# Patient Record
Sex: Female | Born: 1970 | Race: White | Hispanic: No | Marital: Single | Smoking: Former smoker
Health system: Southern US, Community
[De-identification: ages and names within clinical notes are randomized; demographics above are authoritative.]

## PROBLEM LIST (undated history)

## (undated) DIAGNOSIS — C569 Malignant neoplasm of unspecified ovary: Secondary | ICD-10-CM

## (undated) DIAGNOSIS — B001 Herpesviral vesicular dermatitis: Secondary | ICD-10-CM

## (undated) DIAGNOSIS — F329 Major depressive disorder, single episode, unspecified: Secondary | ICD-10-CM

## (undated) DIAGNOSIS — F32A Depression, unspecified: Secondary | ICD-10-CM

## (undated) DIAGNOSIS — N390 Urinary tract infection, site not specified: Secondary | ICD-10-CM

## (undated) HISTORY — PX: ABDOMINAL HYSTERECTOMY: SHX81

---

## 2008-03-09 ENCOUNTER — Emergency Department (HOSPITAL_COMMUNITY): Admission: EM | Admit: 2008-03-09 | Discharge: 2008-03-09 | Payer: Self-pay | Admitting: Emergency Medicine

## 2011-10-13 ENCOUNTER — Emergency Department (HOSPITAL_BASED_OUTPATIENT_CLINIC_OR_DEPARTMENT_OTHER)
Admission: EM | Admit: 2011-10-13 | Discharge: 2011-10-13 | Disposition: A | Discharge status: Home Health | Payer: Medicare Other | Attending: Emergency Medicine | Admitting: Emergency Medicine

## 2011-10-13 ENCOUNTER — Encounter (HOSPITAL_BASED_OUTPATIENT_CLINIC_OR_DEPARTMENT_OTHER): Payer: Self-pay | Admitting: *Deleted

## 2011-10-13 DIAGNOSIS — R300 Dysuria: Secondary | ICD-10-CM | POA: Insufficient documentation

## 2011-10-13 DIAGNOSIS — Z79899 Other long term (current) drug therapy: Secondary | ICD-10-CM | POA: Insufficient documentation

## 2011-10-13 DIAGNOSIS — N390 Urinary tract infection, site not specified: Secondary | ICD-10-CM | POA: Insufficient documentation

## 2011-10-13 DIAGNOSIS — R3915 Urgency of urination: Secondary | ICD-10-CM | POA: Insufficient documentation

## 2011-10-13 DIAGNOSIS — F3289 Other specified depressive episodes: Secondary | ICD-10-CM | POA: Insufficient documentation

## 2011-10-13 DIAGNOSIS — F17200 Nicotine dependence, unspecified, uncomplicated: Secondary | ICD-10-CM | POA: Insufficient documentation

## 2011-10-13 DIAGNOSIS — F329 Major depressive disorder, single episode, unspecified: Secondary | ICD-10-CM | POA: Insufficient documentation

## 2011-10-13 DIAGNOSIS — R350 Frequency of micturition: Secondary | ICD-10-CM | POA: Insufficient documentation

## 2011-10-13 HISTORY — DX: Urinary tract infection, site not specified: N39.0

## 2011-10-13 HISTORY — DX: Depression, unspecified: F32.A

## 2011-10-13 HISTORY — DX: Major depressive disorder, single episode, unspecified: F32.9

## 2011-10-13 LAB — URINALYSIS, ROUTINE W REFLEX MICROSCOPIC
Bilirubin Urine: NEGATIVE
Hgb urine dipstick: NEGATIVE
Ketones, ur: NEGATIVE mg/dL
Nitrite: NEGATIVE
Protein, ur: NEGATIVE mg/dL
Urobilinogen, UA: 0.2 mg/dL (ref 0.0–1.0)

## 2011-10-13 LAB — PREGNANCY, URINE: Preg Test, Ur: NEGATIVE

## 2011-10-13 MED ORDER — PHENAZOPYRIDINE HCL 200 MG PO TABS: 200.0000 mg | ORAL_TABLET | Freq: Three times a day (TID) | ORAL | Status: AC

## 2011-10-13 MED ORDER — NITROFURANTOIN MONOHYD MACRO 100 MG PO CAPS: 100.0000 mg | ORAL_CAPSULE | Freq: Two times a day (BID) | ORAL | Status: AC

## 2011-10-13 NOTE — ED Provider Notes (Signed)
History     CSN: [id number]  Arrival date & time 10/13/11  1422   First MD Initiated Contact with Patient 10/13/11 1501      Chief Complaint  Patient presents with  . Dysuria  . Urinary Frequency  . Urinary Urgency    (Consider location/radiation/quality/duration/timing/severity/associated sxs/prior treatment) Patient is a 41 y.o. female presenting with dysuria. The history is provided by the patient. No language interpreter was used.  Dysuria  This is a new problem. The current episode started yesterday. The problem occurs every urination. The problem has not changed since onset.The quality of the pain is described as burning. The pain is mild. There has been no fever. She is not sexually active. There is no history of pyelonephritis. Associated symptoms include urgency. Pertinent negatives include no hematuria. She has tried nothing for the symptoms. Her past medical history is significant for recurrent UTIs. Her past medical history does not include kidney stones.    Past Medical History  Diagnosis Date  . Depression   . Urinary tract infection     Past Surgical History  Procedure Date  . Abdominal hysterectomy     History reviewed. No pertinent family history.  History  Substance Use Topics  . Smoking status: Current Everyday Smoker  . Smokeless tobacco: Not on file  . Alcohol Use: Yes    OB History    Grav Para Term Preterm Abortions TAB SAB Ect Mult Living                  Review of Systems  Genitourinary: Positive for dysuria and urgency. Negative for hematuria, vaginal bleeding, vaginal discharge, vaginal pain, menstrual problem and pelvic pain.  All other systems reviewed and are negative.    Allergies  Review of patient's allergies indicates no known allergies.  Home Medications   Current Outpatient Rx  Name Route Sig Dispense Refill  . SERTRALINE HCL 50 MG PO TABS Oral Take 50 mg by mouth daily.      BP 121/88  Pulse 97  Temp(Src) 97.7 F  (36.5 C) (Oral)  Resp 20  Ht 5\' 3"  (1.6 m)  Wt 160 lb (72.576 kg)  BMI 28.34 kg/m2  SpO2 98%  Physical Exam  Nursing note and vitals reviewed. Constitutional: She is oriented to person, place, and time. She appears well-developed and well-nourished.  HENT:  Head: Normocephalic and atraumatic.  Right Ear: External ear normal.  Left Ear: External ear normal.  Mouth/Throat: Oropharynx is clear and moist.  Neck: Normal range of motion. Neck supple.  Cardiovascular: Normal rate and normal heart sounds.   Pulmonary/Chest: Effort normal and breath sounds normal.  Abdominal: Soft. Bowel sounds are normal.  Genitourinary: Vagina normal.  Musculoskeletal: Normal range of motion.  Neurological: She is alert and oriented to person, place, and time. She has normal reflexes.  Skin: Skin is warm.  Psychiatric: She has a normal mood and affect.    ED Course  Procedures (including critical care time)   Labs Reviewed  PREGNANCY, URINE  URINALYSIS, ROUTINE W REFLEX MICROSCOPIC   No results found.   No diagnosis found.    MDM  I will treat with macrobid and pyridium due to symptoms.  Pt advised to return if any problems.       Lonia Skinner [place], [place] 10/13/11 1527

## 2011-10-13 NOTE — ED Notes (Signed)
Pt denies vaginal discharge, itching.

## 2011-10-13 NOTE — ED Provider Notes (Signed)
Medical screening examination/treatment/procedure(s) were performed by non-physician practitioner and as supervising physician I was immediately available for consultation/collaboration.   Soliyana Mcchristian A Alexya Mcdaris, MD 10/13/11 2354 

## 2011-10-13 NOTE — ED Notes (Signed)
Pt amb to triage with quick steady gait in nad. Pt reports her usual uti sx x Monday, dysuria, frequency, urgency, denies any fevers.

## 2011-10-13 NOTE — Discharge Instructions (Signed)
Urinary Tract Infection Infections of the urinary tract can start in several places. A bladder infection (cystitis), a kidney infection (pyelonephritis), and a prostate infection (prostatitis) are different types of urinary tract infections (UTIs). They usually get better if treated with medicines (antibiotics) that kill germs. Take all the medicine until it is gone. You or your child may feel better in a few days, but TAKE ALL MEDICINE or the infection may not respond and may become more difficult to treat. HOME CARE INSTRUCTIONS   Drink enough water and fluids to keep the urine clear or pale yellow. Cranberry juice is especially recommended, in addition to large amounts of water.   Avoid caffeine, tea, and carbonated beverages. They tend to irritate the bladder.   Alcohol may irritate the prostate.   Only take over-the-counter or prescription medicines for pain, discomfort, or fever as directed by your caregiver.  To prevent further infections:  Empty the bladder often. Avoid holding urine for long periods of time.   After a bowel movement, women should cleanse from front to back. Use each tissue only once.   Empty the bladder before and after sexual intercourse.  FINDING OUT THE RESULTS OF YOUR TEST Not all test results are available during your visit. If your or your child's test results are not back during the visit, make an appointment with your caregiver to find out the results. Do not assume everything is normal if you have not heard from your caregiver or the medical facility. It is important for you to follow up on all test results. SEEK MEDICAL CARE IF:   There is back pain.   Your baby is older than 3 months with a rectal temperature of 100.5 F (38.1 C) or higher for more than 1 day.   Your or your child's problems (symptoms) are no better in 3 days. Return sooner if you or your child is getting worse.  SEEK IMMEDIATE MEDICAL CARE IF:   There is severe back pain or lower  abdominal pain.   You or your child develops chills.   You have a fever.   Your baby is older than 3 months with a rectal temperature of 102 F (38.9 C) or higher.   Your baby is 3 months old or younger with a rectal temperature of 100.4 F (38 C) or higher.   There is nausea or vomiting.   There is continued burning or discomfort with urination.  MAKE SURE YOU:   Understand these instructions.   Will watch your condition.   Will get help right away if you are not doing well or get worse.  Document Released: 04/14/2005 Document Revised: 06/24/2011 Document Reviewed: 11/17/2006 Exit[employer] Patient Information 2012 [employer].Urinary Tract Infection Infections of the urinary tract can start in several places. A bladder infection (cystitis), a kidney infection (pyelonephritis), and a prostate infection (prostatitis) are different types of urinary tract infections (UTIs). They usually get better if treated with medicines (antibiotics) that kill germs. Take all the medicine until it is gone. You or your child may feel better in a few days, but TAKE ALL MEDICINE or the infection may not respond and may become more difficult to treat. HOME CARE INSTRUCTIONS   Drink enough water and fluids to keep the urine clear or pale yellow. Cranberry juice is especially recommended, in addition to large amounts of water.   Avoid caffeine, tea, and carbonated beverages. They tend to irritate the bladder.   Alcohol may irritate the prostate.   Only take   over-the-counter or prescription medicines for pain, discomfort, or fever as directed by your caregiver.  To prevent further infections:  Empty the bladder often. Avoid holding urine for long periods of time.   After a bowel movement, women should cleanse from front to back. Use each tissue only once.   Empty the bladder before and after sexual intercourse.  FINDING OUT THE RESULTS OF YOUR TEST Not all test results are available during your  visit. If your or your child's test results are not back during the visit, make an appointment with your caregiver to find out the results. Do not assume everything is normal if you have not heard from your caregiver or the medical facility. It is important for you to follow up on all test results. SEEK MEDICAL CARE IF:   There is back pain.   Your baby is older than 3 months with a rectal temperature of 100.5 F (38.1 C) or higher for more than 1 day.   Your or your child's problems (symptoms) are no better in 3 days. Return sooner if you or your child is getting worse.  SEEK IMMEDIATE MEDICAL CARE IF:   There is severe back pain or lower abdominal pain.   You or your child develops chills.   You have a fever.   Your baby is older than 3 months with a rectal temperature of 102 F (38.9 C) or higher.   Your baby is 3 months old or younger with a rectal temperature of 100.4 F (38 C) or higher.   There is nausea or vomiting.   There is continued burning or discomfort with urination.  MAKE SURE YOU:   Understand these instructions.   Will watch your condition.   Will get help right away if you are not doing well or get worse.  Document Released: 04/14/2005 Document Revised: 06/24/2011 Document Reviewed: 11/17/2006 Exit[employer] Patient Information 2012 [employer]. 

## 2011-10-14 LAB — URINE CULTURE: Culture: NO GROWTH

## 2012-12-23 DIAGNOSIS — F3130 Bipolar disorder, current episode depressed, mild or moderate severity, unspecified: Secondary | ICD-10-CM | POA: Insufficient documentation

## 2012-12-23 DIAGNOSIS — F339 Major depressive disorder, recurrent, unspecified: Secondary | ICD-10-CM | POA: Insufficient documentation

## 2012-12-23 DIAGNOSIS — F410 Panic disorder [episodic paroxysmal anxiety] without agoraphobia: Secondary | ICD-10-CM | POA: Insufficient documentation

## 2015-02-14 ENCOUNTER — Encounter (HOSPITAL_BASED_OUTPATIENT_CLINIC_OR_DEPARTMENT_OTHER): Payer: Self-pay

## 2015-02-14 ENCOUNTER — Emergency Department (HOSPITAL_BASED_OUTPATIENT_CLINIC_OR_DEPARTMENT_OTHER)
Admission: EM | Admit: 2015-02-14 | Discharge: 2015-02-14 | Disposition: A | Discharge status: Home / Self Care | Payer: Medicare Other | Attending: Emergency Medicine | Admitting: Emergency Medicine

## 2015-02-14 DIAGNOSIS — F329 Major depressive disorder, single episode, unspecified: Secondary | ICD-10-CM | POA: Insufficient documentation

## 2015-02-14 DIAGNOSIS — Z8543 Personal history of malignant neoplasm of ovary: Secondary | ICD-10-CM | POA: Insufficient documentation

## 2015-02-14 DIAGNOSIS — Z87440 Personal history of urinary (tract) infections: Secondary | ICD-10-CM | POA: Insufficient documentation

## 2015-02-14 DIAGNOSIS — Z720 Tobacco use: Secondary | ICD-10-CM | POA: Insufficient documentation

## 2015-02-14 DIAGNOSIS — B002 Herpesviral gingivostomatitis and pharyngotonsillitis: Secondary | ICD-10-CM | POA: Insufficient documentation

## 2015-02-14 DIAGNOSIS — Z79899 Other long term (current) drug therapy: Secondary | ICD-10-CM | POA: Insufficient documentation

## 2015-02-14 HISTORY — DX: Malignant neoplasm of unspecified ovary: C56.9

## 2015-02-14 HISTORY — DX: Herpesviral vesicular dermatitis: B00.1

## 2015-02-14 MED ORDER — HYDROCODONE-ACETAMINOPHEN 5-325 MG PO TABS: 1.0000 | ORAL_TABLET | ORAL | Status: DC | PRN

## 2015-02-14 MED ORDER — ACYCLOVIR 200 MG PO CAPS
400.0000 mg | ORAL_CAPSULE | Freq: Once | ORAL | Status: AC
  Administered 2015-02-14: 400 mg via ORAL
  Filled 2015-02-14: qty 2

## 2015-02-14 MED ORDER — IBUPROFEN 800 MG PO TABS
800.0000 mg | ORAL_TABLET | Freq: Once | ORAL | Status: AC
  Administered 2015-02-14: 800 mg via ORAL
  Filled 2015-02-14: qty 1

## 2015-02-14 MED ORDER — HYDROCODONE-ACETAMINOPHEN 5-325 MG PO TABS
2.0000 | ORAL_TABLET | Freq: Once | ORAL | Status: AC
  Administered 2015-02-14: 2 via ORAL
  Filled 2015-02-14: qty 2

## 2015-02-14 MED ORDER — ACYCLOVIR 400 MG PO TABS: 400.0000 mg | ORAL_TABLET | Freq: Every day | ORAL | Status: DC

## 2015-02-14 MED ORDER — IBUPROFEN 800 MG PO TABS: 800.0000 mg | ORAL_TABLET | Freq: Three times a day (TID) | ORAL | Status: DC

## 2015-02-14 NOTE — ED Notes (Signed)
MD at bedside. 

## 2015-02-14 NOTE — ED Notes (Addendum)
Presents with blisters on lips, not feeling well, states had fever last PM

## 2015-02-14 NOTE — ED Notes (Signed)
Has pain when swallowing, but no difficulty swallowing, pain from throat radiates to both ears

## 2015-02-14 NOTE — ED Notes (Signed)
Pt here for fever blisters to lip.  Reports hx of same but "not this bad".  Has not been sick recently.

## 2015-02-14 NOTE — ED Provider Notes (Signed)
CSN: [id number]     Arrival date & time 02/14/15  1438 History   First MD Initiated Contact with Patient 02/14/15 1510     Chief Complaint  Patient presents with  . Oral Pain     (Consider location/radiation/quality/duration/timing/severity/associated sxs/prior Treatment) HPI Patient laid out in the sun on Sunday and got somewhat of sunburn.. She then developed fever blisters on her lips. She states that she has a history of fever blisters but they've never been this bad before. Usually she just gets one but now she has several on both her lower and upper lip. She also reports that her lymph nodes are swollen and she feels achy. She has not had a fever that she knows of however she reports she did have some chills last night. The patient reports that her nodes hurt when she bends her chin down but she is not having any difficulty swallowing or breathing. The patient also denied pain in the back of her neck with bending her head forward. She does reports she's had some waxing and waning headache with headache and her temples. At this time she reports the headache is not too severe. No photophobia, no confusion. No other rash. Past Medical History  Diagnosis Date  . Depression   . Urinary tract infection   . Fever blister   . Ovarian cancer    Past Surgical History  Procedure Laterality Date  . Abdominal hysterectomy     No family history on file. History  Substance Use Topics  . Smoking status: Current Every Day Smoker -- 0.50 packs/day    Types: Cigarettes  . Smokeless tobacco: Not on file  . Alcohol Use: No   OB History    No data available     Review of Systems 10 Systems reviewed and are negative for acute change except as noted in the HPI.    Allergies  Review of patient's allergies indicates no known allergies.  Home Medications   Prior to Admission medications   Medication Sig Start Date End Date Taking? Authorizing Provider  ALPRAZolam Duanne Moron) 0.25 MG tablet Take  0.25 mg by mouth at bedtime as needed for anxiety.   Yes Historical Provider, MD  gabapentin (NEURONTIN) 100 MG capsule Take 100 mg by mouth 3 (three) times daily.   Yes Historical Provider, MD  zolpidem (AMBIEN) 5 MG tablet Take 5 mg by mouth at bedtime as needed for sleep.   Yes Historical Provider, MD  acyclovir (ZOVIRAX) 400 MG tablet Take 1 tablet (400 mg total) by mouth 5 (five) times daily. 02/14/15   Charlesetta Shanks, MD  HYDROcodone-acetaminophen (NORCO/VICODIN) 5-325 MG per tablet Take 1-2 tablets by mouth every 4 (four) hours as needed for moderate pain or severe pain. 02/14/15   Charlesetta Shanks, MD  ibuprofen (ADVIL,MOTRIN) 800 MG tablet Take 1 tablet (800 mg total) by mouth 3 (three) times daily. 02/14/15   Charlesetta Shanks, MD  sertraline (ZOLOFT) 50 MG tablet Take 50 mg by mouth daily.    Historical Provider, MD   BP 134/96 mmHg  Pulse 91  Temp(Src) 98 F (36.7 C) (Oral)  Resp 18  Ht 5' 2.75" (1.594 m)  Wt 148 lb (67.132 kg)  BMI 26.42 kg/m2  SpO2 98% Physical Exam  Constitutional: She is oriented to person, place, and time.  Patient is alert and nontoxic. She does appear to be in pain. Mental status is clear. No respiratory distress. Color is good.  HENT:  Head: Normocephalic and atraumatic.  Bilateral TMs normal  without erythema or bulging. No lesions in ear canals. Nares are patent no drainage discharge or bogginess. No lesions associated with the naris. Oral mucosa: The patient's mouth has several erosive ulcer lesions on both the upper and the lower lip, there are several closed vesicles. The lips are moderately swollen. No lesions extend up around the nose or the non-mucosal surfaces. The posterior oropharynx is widely patent without erythema or tonsillar enlargement. No perceivable erosive lesions or ulcers in the posterior oropharynx.  Neck:  The patient has tender tonsillar nodes. No meningismus. Patient can easily place the chin down to her chest and does not endorse pain in  the back of the neck.  Cardiovascular: Normal rate, regular rhythm, normal heart sounds and intact distal pulses.   Pulmonary/Chest: Breath sounds normal. No respiratory distress. She has no wheezes.  Abdominal: Soft. Bowel sounds are normal. She exhibits no distension. There is no tenderness.  No hepatosplenomegaly or tenderness.  Musculoskeletal: Normal range of motion. She exhibits no edema or tenderness.  Lower legs are in good condition without any rash or edema. Feet are warm and dry.  Neurological: She is alert and oriented to person, place, and time. No cranial nerve deficit. She exhibits normal muscle tone. Coordination normal.  Skin: Skin is warm and dry.  Patient does have sunburn to her face. She is deeply tanned and has peeling on the forehead without signs of secondary infection. There is still some sunburn to the nose as well. No vesicle or bullae formation associated with the burn. This is several days old and in the healing phase.  Psychiatric: She has a normal mood and affect.    ED Course  Procedures (including critical care time) Labs Review Labs Reviewed - No data to display  Imaging Review No results found.   EKG Interpretation None      MDM   Final diagnoses:  Herpes stomatitis   Patient's lesions are confined to her lips. She does have labial swelling and lymphadenopathy associated. Patient will be treated with acyclovir. I suspect this is a more exacerbated episode in association with severe sunburn as a trigger.    Charlesetta Shanks, MD 02/14/15 [phone]

## 2015-02-14 NOTE — Discharge Instructions (Signed)
Stomatitis °Stomatitis is an inflammation of the mucous lining of the mouth. It can affect part of the mouth or the whole mouth. The intensity of symptoms can range from mild to severe. It can affect your cheek, teeth, gums, lips, or tongue. In almost all cases, the lining of the mouth becomes swollen, red, and painful. Painful ulcers can develop in your mouth. Stomatitis recurs in some people. °CAUSES  °There are many common causes of stomatitis. They include: °· Viruses (such as cold sores or shingles). °· Canker sores. °· Bacteria (such as ulcerative gingivitis or sexually transmitted diseases). °· Fungus or yeast (such as candidiasis or oral thrush). °· Poor oral hygiene and poor nutrition (Vincent's stomatitis or trench mouth). °· Lack of vitamin B, vitamin C, or niacin. °· Dentures or braces that do not fit properly. °· High acid foods (uncommon). °· Sharp or broken teeth. °· Cheek biting. °· Breathing through the mouth. °· Chewing tobacco. °· Allergy to toothpaste, mouthwash, candy, gum, lipstick, or some medicines. °· Burning your mouth with hot drinks or food. °· Exposure to dyes, heavy metals, acid fumes, or mineral dust. °SYMPTOMS  °· Painful ulcers in the mouth. °· Blisters in the mouth. °· Bleeding gums. °· Swollen gums. °· Irritability. °· Bad breath. °· Bad taste in the mouth. °· Fever. °· Trouble eating because of burning and pain in the mouth. °DIAGNOSIS  °Your caregiver will examine your mouth and look for bleeding gums and mouth ulcers. Your caregiver may ask you about the medicines you are taking. Your caregiver may suggest a blood test and tissue sample (biopsy) of the mouth ulcer or mass if either is present. This will help find the cause of your condition. °TREATMENT  °Your treatment will depend on the cause of your condition. Your caregiver will first try to treat your symptoms.  °· You may be given pain medicine. Topical anesthetic may be used to numb the area if you have severe  pain. °· Your caregiver may prescribe antibiotic medicine if you have a bacterial infection. °· Your caregiver may prescribe antifungal medicine if you have a fungal infection. °· You may need to take antiviral medicine if you have a viral infection like herpes. °· You may be asked to use medicated mouth rinses. °· Your caregiver will advise you about proper brushing and using a soft toothbrush. You also need to get your teeth cleaned regularly. °HOME CARE INSTRUCTIONS  °· Maintain good oral hygiene. This is especially important for transplant patients. °· Brush your teeth carefully with a soft, nylon-bristled toothbrush. °· Floss at least 2 times a day. °· Clean your mouth after eating. °· Rinse your mouth with salt water 3 to 4 times a day. °· Gargle with cold water. °· Use topical numbing medicines to decrease pain if recommended by your caregiver. °· Stop smoking, and stop using chewing or smokeless tobacco. °· Avoid eating hot and spicy foods. °· Eat soft and bland food. °· Reduce your stress wherever possible. °· Eat healthy and nutritious foods. °SEEK MEDICAL CARE IF:  °· Your symptoms persist or get worse. °· You develop new symptoms. °· Your mouth ulcers are present for more than 3 weeks. °· Your mouth ulcers come back frequently. °· You have increasing difficulty with normal eating and drinking. °· You have increasing fatigue or weakness. °· You develop loss of appetite or nausea. °SEEK IMMEDIATE MEDICAL CARE IF:  °· You have a fever. °· You develop pain, redness, or sores around one or both   eyes. °· You cannot eat or drink because of pain or other symptoms. °· You develop worsening weakness, or you faint. °· You develop vomiting or diarrhea. °· You develop chest pain, shortness of breath, or rapid and irregular heartbeats. °MAKE SURE YOU: °· Understand these instructions. °· Will watch your condition. °· Will get help right away if you are not doing well or get worse. °Document Released: 05/02/2007  Document Revised: 09/27/2011 Document Reviewed: 02/11/2011 °[employer]® Patient Information ©2015 [employer]. This information is not intended to replace advice given to you by your health care provider. Make sure you discuss any questions you have with your health care provider. ° °

## 2015-10-09 ENCOUNTER — Emergency Department (HOSPITAL_BASED_OUTPATIENT_CLINIC_OR_DEPARTMENT_OTHER): Payer: Self-pay

## 2015-10-09 ENCOUNTER — Encounter (HOSPITAL_BASED_OUTPATIENT_CLINIC_OR_DEPARTMENT_OTHER): Payer: Self-pay | Admitting: *Deleted

## 2015-10-09 ENCOUNTER — Emergency Department (HOSPITAL_BASED_OUTPATIENT_CLINIC_OR_DEPARTMENT_OTHER)
Admission: EM | Admit: 2015-10-09 | Discharge: 2015-10-09 | Disposition: A | Discharge status: Home / Self Care | Payer: Self-pay | Attending: Emergency Medicine | Admitting: Emergency Medicine

## 2015-10-09 DIAGNOSIS — J111 Influenza due to unidentified influenza virus with other respiratory manifestations: Secondary | ICD-10-CM | POA: Insufficient documentation

## 2015-10-09 DIAGNOSIS — F17210 Nicotine dependence, cigarettes, uncomplicated: Secondary | ICD-10-CM | POA: Insufficient documentation

## 2015-10-09 DIAGNOSIS — Z79899 Other long term (current) drug therapy: Secondary | ICD-10-CM | POA: Insufficient documentation

## 2015-10-09 DIAGNOSIS — Z791 Long term (current) use of non-steroidal anti-inflammatories (NSAID): Secondary | ICD-10-CM | POA: Insufficient documentation

## 2015-10-09 DIAGNOSIS — Z87440 Personal history of urinary (tract) infections: Secondary | ICD-10-CM | POA: Insufficient documentation

## 2015-10-09 DIAGNOSIS — R69 Illness, unspecified: Secondary | ICD-10-CM

## 2015-10-09 DIAGNOSIS — F329 Major depressive disorder, single episode, unspecified: Secondary | ICD-10-CM | POA: Insufficient documentation

## 2015-10-09 DIAGNOSIS — Z8619 Personal history of other infectious and parasitic diseases: Secondary | ICD-10-CM | POA: Insufficient documentation

## 2015-10-09 DIAGNOSIS — J159 Unspecified bacterial pneumonia: Secondary | ICD-10-CM | POA: Insufficient documentation

## 2015-10-09 DIAGNOSIS — Z8543 Personal history of malignant neoplasm of ovary: Secondary | ICD-10-CM | POA: Insufficient documentation

## 2015-10-09 DIAGNOSIS — J189 Pneumonia, unspecified organism: Secondary | ICD-10-CM

## 2015-10-09 LAB — I-STAT CG4 LACTIC ACID, ED: LACTIC ACID, VENOUS: 1.76 mmol/L (ref 0.5–2.0)

## 2015-10-09 LAB — CBC WITH DIFFERENTIAL/PLATELET
BASOS ABS: 0 10*3/uL (ref 0.0–0.1)
Basophils Relative: 0 %
EOS PCT: 0 %
Eosinophils Absolute: 0 10*3/uL (ref 0.0–0.7)
HCT: 43.1 % (ref 36.0–46.0)
Hemoglobin: 14.7 g/dL (ref 12.0–15.0)
LYMPHS PCT: 17 %
Lymphs Abs: 0.8 10*3/uL (ref 0.7–4.0)
MCH: 30.7 pg (ref 26.0–34.0)
MCHC: 34.1 g/dL (ref 30.0–36.0)
MCV: 90 fL (ref 78.0–100.0)
Monocytes Absolute: 0.5 10*3/uL (ref 0.1–1.0)
Monocytes Relative: 10 %
Neutro Abs: 3.4 10*3/uL (ref 1.7–7.7)
Neutrophils Relative %: 73 %
PLATELETS: 144 10*3/uL — AB (ref 150–400)
RBC: 4.79 MIL/uL (ref 3.87–5.11)
RDW: 13.1 % (ref 11.5–15.5)
WBC: 4.6 10*3/uL (ref 4.0–10.5)

## 2015-10-09 LAB — BASIC METABOLIC PANEL
ANION GAP: 11 (ref 5–15)
BUN: 11 mg/dL (ref 6–20)
CO2: 23 mmol/L (ref 22–32)
Calcium: 9.2 mg/dL (ref 8.9–10.3)
Chloride: 104 mmol/L (ref 101–111)
Creatinine, Ser: 1.05 mg/dL — ABNORMAL HIGH (ref 0.44–1.00)
GFR calc Af Amer: >60 mL/min (ref >60)
Glucose, Bld: 133 mg/dL — ABNORMAL HIGH (ref 65–99)
Potassium: 3.8 mmol/L (ref 3.5–5.1)
Sodium: 138 mmol/L (ref 135–145)

## 2015-10-09 MED ORDER — CEFTRIAXONE SODIUM 1 G IJ SOLR
1.0000 g | Freq: Once | INTRAMUSCULAR | Status: DC
Start: 2015-10-09 — End: 2015-10-09

## 2015-10-09 MED ORDER — HYDROCODONE-ACETAMINOPHEN 5-325 MG PO TABS: ORAL_TABLET | ORAL | Status: DC

## 2015-10-09 MED ORDER — IBUPROFEN 400 MG PO TABS
400.0000 mg | ORAL_TABLET | Freq: Once | ORAL | Status: AC
Start: 2015-10-09 — End: 2015-10-09
  Administered 2015-10-09: 400 mg via ORAL

## 2015-10-09 MED ORDER — ALBUTEROL SULFATE (2.5 MG/3ML) 0.083% IN NEBU
5.0000 mg | INHALATION_SOLUTION | Freq: Once | RESPIRATORY_TRACT | Status: AC
  Administered 2015-10-09: 5 mg via RESPIRATORY_TRACT
  Filled 2015-10-09: qty 6

## 2015-10-09 MED ORDER — IBUPROFEN 400 MG PO TABS
ORAL_TABLET | ORAL | Status: AC
  Administered 2015-10-09: 400 mg via ORAL
  Filled 2015-10-09: qty 1

## 2015-10-09 MED ORDER — HYDROCODONE-ACETAMINOPHEN 5-325 MG PO TABS
1.0000 | ORAL_TABLET | Freq: Once | ORAL | Status: AC
  Administered 2015-10-09: 1 via ORAL
  Filled 2015-10-09: qty 1

## 2015-10-09 MED ORDER — AZITHROMYCIN 250 MG PO TABS: ORAL_TABLET | ORAL | Status: DC

## 2015-10-09 MED ORDER — DEXTROSE 5 % IV SOLN
1.0000 g | Freq: Once | INTRAVENOUS | Status: AC
  Administered 2015-10-09: 1 g via INTRAVENOUS
  Filled 2015-10-09: qty 10

## 2015-10-09 MED ORDER — ALBUTEROL SULFATE HFA 108 (90 BASE) MCG/ACT IN AERS: 2.0000 | INHALATION_SPRAY | RESPIRATORY_TRACT | Status: DC | PRN

## 2015-10-09 MED FILL — PROVENTIL HFA 90 MCG INH: 108 (90 BAS | 30 days supply | Qty: 7 | Fill #0

## 2015-10-09 MED FILL — AZITHROMYCIN 250 MG TABLET: 250 | 5 days supply | Qty: 6 | Fill #0

## 2015-10-09 MED FILL — HYDROCODON-APAP 5-325: 5-325 | 1 days supply | Qty: 7 | Fill #0

## 2015-10-09 NOTE — ED Notes (Signed)
Patient states she has had flu like symptoms for the last week.  C/o generalized body aches, headache, chills and sweats, and non productive cough.

## 2015-10-09 NOTE — Discharge Instructions (Signed)
It is very important that you have a repeat chest x-ray in 4-6 weeks to make sure that this infection is cleared and that the abnormality that is seen on the x-ray is not a cancer. If you have any issues getting into see a primary care doctor to accomplish this you can return to the emergency room for repeat x-ray.  Return to the emergency room for any worsening or concerning symptoms including fast breathing, heart racing, confusion, vomiting.  Rest, cover your mouth when you cough and wash your hands frequently.   Push fluids: water or Gatorade, do not drink any soda, juice or caffeinated beverages.  For fever and pain control you can take Motrin (ibuprofen) as follows: 400 mg (this is normally 2 over the counter pills) every 4 hours with food.  Do not return to work until 2 days after your fever breaks.   Take Vicodin for cough and pain control, do not drink alcohol, drive, care for children or do other critical tasks while taking Vicodin      Community-Acquired Pneumonia, Adult Pneumonia is an infection of the lungs. One type of pneumonia can happen while a person is in a hospital. A different type can happen when a person is not in a hospital (community-acquired pneumonia). It is easy for this kind to spread from person to person. It can spread to you if you breathe near an infected person who coughs or sneezes. Some symptoms include:  A dry cough.  A wet (productive) cough.  Fever.  Sweating.  Chest pain. HOME CARE  Take over-the-counter and prescription medicines only as told by your doctor.  Only take cough medicine if you are losing sleep.  If you were prescribed an antibiotic medicine, take it as told by your doctor. Do not stop taking the antibiotic even if you start to feel better.  Sleep with your head and neck raised (elevated). You can do this by putting a few pillows under your head, or you can sleep in a recliner.  Do not use tobacco products. These include  cigarettes, chewing tobacco, and e-cigarettes. If you need help quitting, ask your doctor.  Drink enough water to keep your pee (urine) clear or pale yellow. A shot (vaccine) can help prevent pneumonia. Shots are often suggested for:  People older than 46 years of age.  People older than 46 years of age:  Who are having cancer treatment.  Who have long-term (chronic) lung disease.  Who have problems with their body's defense system (immune system). You may also prevent pneumonia if you take these actions:  Get the flu (influenza) shot every year.  Go to the dentist as often as told.  Wash your hands often. If soap and water are not available, use hand sanitizer. GET HELP IF:  You have a fever.  You lose sleep because your cough medicine does not help. GET HELP RIGHT AWAY IF:  You are short of breath and it gets worse.  You have more chest pain.  Your sickness gets worse. This is very serious if:  You are an older adult.  Your body's defense system is weak.  You cough up blood.   This information is not intended to replace advice given to you by your health care provider. Make sure you discuss any questions you have with your health care provider.   Document Released: 12/22/2007 Document Revised: 03/26/2015 Document Reviewed: 10/30/2014 Elsevier Interactive Patient Education 2016 [place] [employer]  211 is a great source of information about community services available.  Access by dialing 2-1-1 from anywhere in [place], or by website -  CustodianSupply.fi.   Other Local Resources (Updated 07/2015)  [place]    Phone Number and Address  [place] medical care - 1st and 3rd Saturday of every month  Must not qualify for public or private insurance and must have limited income [phone] [address], [place]  Child care  Emergency assistance for housing and [employer]  Medicaid [phone] [address], [place] [address]   [facility] Department  Low-cost medical care for children, communicable diseases, sexually-transmitted diseases, immunizations, maternity care, womens health and family planning [phone] [address], [place] [address]  [facility]   Medication assistance for [facility] residents  Must meet income requirements [phone] [place], [place].    [place]  Child care  Emergency assistance for housing and [employer]  Medicaid [phone] [address] [place], [place] [address]  [facility] and [place]   Low-cost medical care,   Monday through Friday, 9 am to 6 pm.   Accepts Medicare/Medicaid, and self-pay [phone] [address] [place], [place] [address]  [facility] for [place] care - Monday through Friday, 8:30 am - 5:30 pm  Accepts Medicaid and self-pay [phone] [address] [place], [place] [address]   [facility]  Primary medical care, including for those with sickle cell disease  Accepts Medicare, Medicaid, insurance and self-pay [phone] [place], [place]  [facility]   Primary medical care  Accepts Medicare, [place], insurance and self-pay [phone] [address] [place], [place] [address]   [facility] Department of Social Services  Child care  Emergency assistance for housing and [employer]  Medicaid [phone] [address] [place], [place] [address]  [place] Department of Health and [employer]  Child care  Emergency assistance for housing and  [employer]  Medicaid [phone] [place], [place] [address]   [facility] Medication Assistance Program  Medication assistance for [facility] residents with no insurance only  Must have a primary care doctor [phone] [address], [place], [place]  [facility]   Primary medical care  [place], [place], insurance  [phone] [address] [place], [place]  [facility]   Medication assistance [phone]  [facility]   Primary medical care  Accepts Medicare, [place], insurance and self-pay [phone] [address] [place], [place] [address]  [facility]   Primary medical care  Accepts Medicare, [place], insurance and self-pay [phone] [address], [place] [address]  [facility]  For [place] residents between the ages of 43 and 76 who do not have any form of health insurance, Medicare, [place], or [place] benefits.  Services are provided free of charge to uninsured patients who fall within federal poverty guidelines.    Hours: Tuesdays and Thursdays, 4:15 - 8 pm [phone] [address] [place], [place] [address]  [facility]     Primary medical care  Dental care  Nutritional counseling  Pharmacy  Accepts Medicaid, Medicare, most insurance.  Fees are adjusted based on ability to pay.   [place], [place] [address], [place], [place] [facility], [place], [place] [facility] [place], [place]  [facility]  Womens health and family planning [phone] [address] [place], [place] care  Emergency assistance for housing and [employer]  Medicaid [phone] [address], [place], [place] [address]   [facility]    Ages 51 and older  Hours: Mondays and Thursdays, 7:00 am - 9:00 am Patients are seen on a first come, first served basis. [phone], ext. [place], [place]  Child care  Emergency assistance for housing and [employer]  Medicaid [phone] [address], [place] [address]  [facility]  Medication assistance  Rental assistance  Food pantry  Medication assistance  Housing assistance  Emergency food distribution  Utility assistance [place], [place]  [address] [place], [place] [address] Hours: Tuesdays and Thursdays from 9am - 12 noon by appointment only  [place], [place] [address]  [facility] private insurance, [place], and [place].  Payment is based on a sliding scale for those without insurance.  Hours: Mondays, Tuesdays and Thursdays, 8:30 am - 5:30 pm.   [phone] [place], [place]  [facility] - Family Medicine at [facility], [place], and [place].  Payment is based on a sliding scale for those without insurance. [phone] [address] [place], [place]  [facility] - Pediatrics at [address], research (medical), [employer], and [place].  Payment is based on a sliding scale for those without insurance [phone] [address] [place], [employer], [place]  [facility] - Pediatrics at [employer], [place], and [place].  Payment is based on  a sliding scale for those without insurance. [phone] [place], [place]  [facility] - Pediatrics at [facility], [place], and [place].  Payment is based on a sliding scale for those without insurance. [phone], ext. [phone] [place], [place].    [place] care.  Accepts Medicaid and self-pay. [phone] [place], [place]  Return to the emergency room for any worsening or concerning symptoms including fast breathing, heart racing, confusion, vomiting.

## 2015-10-09 NOTE — ED Provider Notes (Signed)
CSN: [id number]     Arrival date & time 10/09/15  [id number] History   First MD Initiated Contact with Patient 10/09/15 0932     Chief Complaint  Patient presents with  . Influenza     (Consider location/radiation/quality/duration/timing/severity/associated sxs/prior Treatment) HPI  Blood pressure 121/93, pulse 84, temperature 98.8 F (37.1 C), temperature source Oral, resp. rate 20, height 5\' 3"  (1.6 m), weight 63.504 kg, SpO2 97 %.  Amber Sutton is a 45 y.o. female complaining Tactile fever, chills, dry cough, shortness of breath, rhinorrhea, headache, myalgia, sore throat, single episode of nonbloody, nonbilious, non-coffee-ground emesis yesterday. All other symptoms started 4 days ago. Patient denies focal chest pain, focal abdominal pain, change in bowel or bladder habits. No history of COPD however patient is a active daily smoker she hasn't smoked recently due to illness.   Past Medical History  Diagnosis Date  . Depression   . Urinary tract infection   . Fever blister   . Ovarian cancer [facility])    Past Surgical History  Procedure Laterality Date  . Abdominal hysterectomy    . Cesarean section     No family history on file. Social History  Substance Use Topics  . Smoking status: Current Every Day Smoker -- 0.50 packs/day    Types: Cigarettes  . Smokeless tobacco: None  . Alcohol Use: No   OB History    No data available     Review of Systems  10 systems reviewed and found to be negative, except as noted in the HPI.   Allergies  Review of patient's allergies indicates no known allergies.  Home Medications   Prior to Admission medications   Medication Sig Start Date End Date Taking? Authorizing Provider  ALPRAZolam Duanne Moron) 0.25 MG tablet Take 0.25 mg by mouth at bedtime as needed for anxiety.   Yes Historical Provider, MD  gabapentin (NEURONTIN) 100 MG capsule Take 100 mg by mouth 3 (three) times daily.   Yes Historical Provider, MD  OVER THE COUNTER MEDICATION  Goodie Powders   Yes Historical Provider, MD  sertraline (ZOLOFT) 50 MG tablet Take 50 mg by mouth daily.   Yes Historical Provider, MD  zolpidem (AMBIEN) 5 MG tablet Take 5 mg by mouth at bedtime as needed for sleep.   Yes Historical Provider, MD  acyclovir (ZOVIRAX) 400 MG tablet Take 1 tablet (400 mg total) by mouth 5 (five) times daily. 02/14/15   Charlesetta Shanks, MD  albuterol (PROVENTIL HFA;VENTOLIN HFA) 108 (90 Base) MCG/ACT inhaler Inhale 2 puffs into the lungs every 4 (four) hours as needed for wheezing or shortness of breath. 10/09/15   Elmyra Ricks Aerik Polan, PA-C  azithromycin (ZITHROMAX Z-PAK) 250 MG tablet 2 po day one, then 1 daily x 4 days 10/09/15   Elmyra Ricks Dyneshia Baccam, PA-C  HYDROcodone-acetaminophen (NORCO/VICODIN) 5-325 MG tablet Take 1-2 tablets by mouth every 6 hours as needed for pain and/or cough. 10/09/15   Kaveri Perras, PA-C  ibuprofen (ADVIL,MOTRIN) 800 MG tablet Take 1 tablet (800 mg total) by mouth 3 (three) times daily. 02/14/15   Charlesetta Shanks, MD   BP 119/81 mmHg  Pulse 88  Temp(Src) 98.9 F (37.2 C) (Oral)  Resp 18  Ht 5\' 3"  (1.6 m)  Wt 63.504 kg  BMI 24.81 kg/m2  SpO2 98% Physical Exam  Constitutional: She is oriented to person, place, and time. She appears well-developed and well-nourished. No distress.  HENT:  Head: Normocephalic and atraumatic.  Right Ear: External ear normal.  Left Ear: External ear normal.  Mouth/Throat: Oropharynx is clear and moist. No oropharyngeal exudate.  No drooling or stridor. Posterior pharynx mildly erythematous no significant tonsillar hypertrophy. No exudate. Soft palate rises symmetrically. No TTP or induration under tongue.   No tenderness to palpation of frontal or bilateral maxillary sinuses.  Mild mucosal edema in the nares with scant rhinorrhea.  Bilateral tympanic membranes with normal architecture and good light reflex.    Eyes: Conjunctivae and EOM are normal. Pupils are equal, round, and reactive to light.  Neck:  Normal range of motion. Neck supple.  Cardiovascular: Normal rate, regular rhythm and intact distal pulses.   Pulmonary/Chest: Effort normal and breath sounds normal. No stridor. No respiratory distress. She has no wheezes. She has no rales. She exhibits no tenderness.  Abdominal: Soft. There is no tenderness. There is no rebound and no guarding.  Musculoskeletal: Normal range of motion.  Neurological: She is alert and oriented to person, place, and time.  Skin: She is not diaphoretic.  Psychiatric: She has a normal mood and affect.  Nursing note and vitals reviewed.   ED Course  Procedures (including critical care time) Labs Review Labs Reviewed  CBC WITH DIFFERENTIAL/PLATELET - Abnormal; Notable for the following:    Platelets 144 (*)    All other components within normal limits  BASIC METABOLIC PANEL - Abnormal; Notable for the following:    Glucose, Bld 133 (*)    Creatinine, Ser 1.05 (*)    All other components within normal limits  CULTURE, BLOOD (SINGLE)  I-STAT CG4 LACTIC ACID, ED    Imaging Review Dg Chest 2 View  10/09/2015  CLINICAL DATA:  Flu-like symptoms. Nonproductive cough. Symptoms for 4 days EXAM: CHEST  2 VIEW COMPARISON:  None. FINDINGS: Hazy left basilar airspace disease. No pneumothorax. No pleural effusion. Normal heart size. IMPRESSION: Left basilar airspace disease. Followup PA and lateral chest X-ray is recommended in 3-4 weeks following trial of antibiotic therapy to ensure resolution and exclude underlying malignancy. Electronically Signed   By: Marybelle Killings M.D.   On: 10/09/2015 10:15   I have personally reviewed and evaluated these images and lab results as part of my medical decision-making.   EKG Interpretation None      MDM   Final diagnoses:  CAP (community acquired pneumonia)  Influenza-like illness    Filed Vitals:   10/09/15 0937 10/09/15 0953 10/09/15 1019 10/09/15 1201  BP: 121/93   119/81  Pulse: 84   88  Temp: 98.8 F (37.1 C)    98.9 F (37.2 C)  TempSrc: Oral   Oral  Resp: 20   18  Height: 5\' 3"  (1.6 m)     Weight: 63.504 kg     SpO2: 97% 97% 98% 98%    Medications  HYDROcodone-acetaminophen (NORCO/VICODIN) 5-325 MG per tablet 1 tablet (1 tablet Oral Given 10/09/15 1145)  albuterol (PROVENTIL) (2.5 MG/3ML) 0.083% nebulizer solution 5 mg (5 mg Nebulization Given 10/09/15 1018)  cefTRIAXone (ROCEPHIN) 1 g in dextrose 5 % 50 mL IVPB (1 g Intravenous New Bag/Given 10/09/15 1145)  ibuprofen (ADVIL,MOTRIN) tablet 400 mg (400 mg Oral Given 10/09/15 1145)    Amber Sutton is 44 y.o. female presenting with Rhinorrhea, cough, shortness of breath, myalgia. Lung sounds clear to auscultation, patient saturating well on room air, she is afebrile, overall well appearing. Chest x-ray reveals a left basilar airspace disease, they recommend follow-up in 4 weeks after completion of antibiotic to ensure that this is not cancer. Patient is started on a community-acquired regimen. I've  advised her this critically important that she have a repeat x-ray, she is a smoker and we have discussed the possibility that this might be a malignancy. Patient understands. I will give her a resource guide of also advised her that if she has any issue whatsoever in getting into see primary care she is to return to the ED for repeat x-ray.  Will check basic blood work.   Blood work reassuring with normal leukocytosis, normal lactic acid level. We'll draw single blood culture as patient is very afraid of needles and requesting to minimize sticks.  Evaluation does not show pathology that would require ongoing emergent intervention or inpatient treatment. Pt is hemodynamically stable and mentating appropriately. Discussed findings and plan with patient/guardian, who agrees with care plan. All questions answered. Return precautions discussed and outpatient follow up given.   New Prescriptions   ALBUTEROL (PROVENTIL HFA;VENTOLIN HFA) 108 (90 BASE) MCG/ACT INHALER     Inhale 2 puffs into the lungs every 4 (four) hours as needed for wheezing or shortness of breath.   AZITHROMYCIN (ZITHROMAX Z-PAK) 250 MG TABLET    2 po day one, then 1 daily x 4 days   HYDROCODONE-ACETAMINOPHEN (NORCO/VICODIN) 5-325 MG TABLET    Take 1-2 tablets by mouth every 6 hours as needed for pain and/or cough.         Monico Blitz, PA-C 10/09/15 1252  Orlie Dakin, MD 10/09/15 1537

## 2015-10-14 LAB — CULTURE, BLOOD (SINGLE): CULTURE: NO GROWTH

## 2015-11-30 ENCOUNTER — Emergency Department (HOSPITAL_BASED_OUTPATIENT_CLINIC_OR_DEPARTMENT_OTHER)
Admission: EM | Admit: 2015-11-30 | Discharge: 2015-11-30 | Disposition: A | Discharge status: Home / Self Care | Payer: Self-pay | Attending: Emergency Medicine | Admitting: Emergency Medicine

## 2015-11-30 ENCOUNTER — Encounter (HOSPITAL_BASED_OUTPATIENT_CLINIC_OR_DEPARTMENT_OTHER): Payer: Self-pay | Admitting: Emergency Medicine

## 2015-11-30 DIAGNOSIS — N390 Urinary tract infection, site not specified: Secondary | ICD-10-CM | POA: Insufficient documentation

## 2015-11-30 DIAGNOSIS — F329 Major depressive disorder, single episode, unspecified: Secondary | ICD-10-CM | POA: Insufficient documentation

## 2015-11-30 DIAGNOSIS — R110 Nausea: Secondary | ICD-10-CM | POA: Insufficient documentation

## 2015-11-30 DIAGNOSIS — F17210 Nicotine dependence, cigarettes, uncomplicated: Secondary | ICD-10-CM | POA: Insufficient documentation

## 2015-11-30 LAB — URINE MICROSCOPIC-ADD ON

## 2015-11-30 LAB — URINALYSIS, ROUTINE W REFLEX MICROSCOPIC
BILIRUBIN URINE: NEGATIVE
Glucose, UA: NEGATIVE mg/dL
KETONES UR: NEGATIVE mg/dL
NITRITE: POSITIVE — AB
PH: 6 (ref 5.0–8.0)
Protein, ur: 100 mg/dL — AB
Specific Gravity, Urine: 1.009 (ref 1.005–1.030)

## 2015-11-30 MED ORDER — PHENAZOPYRIDINE HCL 200 MG PO TABS: 200.0000 mg | ORAL_TABLET | Freq: Three times a day (TID) | ORAL | Status: DC

## 2015-11-30 MED ORDER — CEPHALEXIN 500 MG PO CAPS: 500.0000 mg | ORAL_CAPSULE | Freq: Four times a day (QID) | ORAL | Status: DC

## 2015-11-30 NOTE — ED Provider Notes (Signed)
CSN: [id number]     Arrival date & time 11/30/15  1525 History   First MD Initiated Contact with Patient 11/30/15 1632     Chief Complaint  Patient presents with  . Dysuria  . Abdominal Pain    (Consider location/radiation/quality/duration/timing/severity/associated sxs/prior Treatment) Patient is a 45 y.o. female presenting with dysuria and abdominal pain. The history is provided by the patient and medical records. No language interpreter was used.  Dysuria Associated symptoms: abdominal pain   Associated symptoms: no fever and no vomiting   Abdominal Pain Associated symptoms: dysuria   Associated symptoms: no chills, no cough, no fever, no shortness of breath, no sore throat and no vomiting    Lovisa Misner is a 45 y.o. female who presents to ED for worsening dysuria x 3 days. Associated symptoms include urinary frequency. Azos taken PTA with no relief. No fever, back pain. Intermittent nausea, none at present. No emesis. Feels similar to previous UTI's. No UTI in several years.   No ABX allergies.   Past Medical History  Diagnosis Date  . Depression   . Urinary tract infection   . Fever blister   . Ovarian cancer [facility])    Past Surgical History  Procedure Laterality Date  . Abdominal hysterectomy    . Cesarean section     History reviewed. No pertinent family history. Social History  Substance Use Topics  . Smoking status: Current Every Day Smoker -- 0.50 packs/day    Types: Cigarettes  . Smokeless tobacco: None  . Alcohol Use: No   OB History    No data available     Review of Systems  Constitutional: Negative for fever and chills.  HENT: Negative for congestion and sore throat.   Eyes: Negative for visual disturbance.  Respiratory: Negative for cough and shortness of breath.   Cardiovascular: Negative.   Gastrointestinal: Positive for abdominal pain. Negative for vomiting.  Genitourinary: Positive for dysuria and frequency.  Musculoskeletal: Negative for  myalgias and neck pain.  Skin: Negative for rash.  Neurological: Negative for headaches.      Allergies  Review of patient's allergies indicates no known allergies.  Home Medications   Prior to Admission medications   Medication Sig Start Date End Date Taking? Authorizing Provider  acyclovir (ZOVIRAX) 400 MG tablet Take 1 tablet (400 mg total) by mouth 5 (five) times daily. 02/14/15   Charlesetta Shanks, MD  albuterol (PROVENTIL HFA;VENTOLIN HFA) 108 (90 Base) MCG/ACT inhaler Inhale 2 puffs into the lungs every 4 (four) hours as needed for wheezing or shortness of breath. 10/09/15   Elmyra Ricks Pisciotta, PA-C  ALPRAZolam Duanne Moron) 0.25 MG tablet Take 0.25 mg by mouth at bedtime as needed for anxiety.    Historical Provider, MD  azithromycin (ZITHROMAX Z-PAK) 250 MG tablet 2 po day one, then 1 daily x 4 days 10/09/15   Elmyra Ricks Pisciotta, PA-C  cephALEXin (KEFLEX) 500 MG capsule Take 1 capsule (500 mg total) by mouth 4 (four) times daily. 11/30/15   Ozella Almond Aleece Loyd, PA-C  gabapentin (NEURONTIN) 100 MG capsule Take 100 mg by mouth 3 (three) times daily.    Historical Provider, MD  HYDROcodone-acetaminophen (NORCO/VICODIN) 5-325 MG tablet Take 1-2 tablets by mouth every 6 hours as needed for pain and/or cough. 10/09/15   Nicole Pisciotta, PA-C  ibuprofen (ADVIL,MOTRIN) 800 MG tablet Take 1 tablet (800 mg total) by mouth 3 (three) times daily. 02/14/15   Charlesetta Shanks, MD  OVER THE COUNTER MEDICATION Goodie Powders    Historical Provider, MD  phenazopyridine (PYRIDIUM) 200 MG tablet Take 1 tablet (200 mg total) by mouth 3 (three) times daily. 11/30/15   Ozella Almond Leighanna Kirn, PA-C  sertraline (ZOLOFT) 50 MG tablet Take 50 mg by mouth daily.    Historical Provider, MD  zolpidem (AMBIEN) 5 MG tablet Take 5 mg by mouth at bedtime as needed for sleep.    Historical Provider, MD   BP 115/87 mmHg  Pulse 92  Temp(Src) 98.8 F (37.1 C) (Oral)  Resp 18  Ht 5\' 3"  (1.6 m)  Wt 63.504 kg  BMI 24.81 kg/m2  SpO2  96% Physical Exam  Constitutional: She is oriented to person, place, and time. She appears well-developed and well-nourished.  Alert and in no acute distress  HENT:  Head: Normocephalic and atraumatic.  Cardiovascular: Normal rate, regular rhythm and normal heart sounds.   Pulmonary/Chest: Effort normal. No respiratory distress.  Abdominal: Soft. Bowel sounds are normal. She exhibits no distension and no mass. There is tenderness (Suprapubic). There is no rebound and no guarding.  No CVA tenderness.   Musculoskeletal: Normal range of motion.  Neurological: She is alert and oriented to person, place, and time.  Skin: Skin is warm and dry.  Nursing note and vitals reviewed.   ED Course  Procedures (including critical care time) Labs Review Labs Reviewed  URINALYSIS, ROUTINE W REFLEX MICROSCOPIC (NOT AT [facility]) - Abnormal; Notable for the following:    APPearance TURBID (*)    Hgb urine dipstick LARGE (*)    Protein, ur 100 (*)    Nitrite POSITIVE (*)    Leukocytes, UA LARGE (*)    All other components within normal limits  URINE MICROSCOPIC-ADD ON - Abnormal; Notable for the following:    Squamous Epithelial / LPF 0-5 (*)    Bacteria, UA MANY (*)    All other components within normal limits    Imaging Review No results found. I have personally reviewed and evaluated these images and lab results as part of my medical decision-making.   EKG Interpretation None      MDM   Final diagnoses:  UTI (lower urinary tract infection)   Etter Sjogren presents to ED with dysuria x 3 days c/w previous UTI's. Afebrile, VSS. No CVA tenderness. Suprapubic tenderness on exam. History & physical c/w UTI. UA obtained which shows nitrite + and large leuks. Will treat with keflex. Rx for pyridum given. Return precautions given. PCP follow up encouraged. All questions answered.     [facility] Tambi Thole, PA-C 11/30/15 [place], DO 11/30/15 1701

## 2015-11-30 NOTE — ED Notes (Signed)
Pt in c/o abd pain, dysuria onset yesterday. Endorses nausea but denies v/d. States feels like UTI.

## 2015-11-30 NOTE — Discharge Instructions (Signed)
Stay very well hydrated with plenty of water throughout the day. Please take antibiotic until completion. Use pyridium as directed to decrease painful urination but know that a common side effect is to turn your urine a bright orange/red color. This is not a harmful side effect. Follow up with primary care physician in 1 week for recheck of ongoing symptoms.  Please seek immediate care if you develop the following: Your symptoms are no better or worse in 3 days. There is severe back pain or lower abdominal pain.  You develop chills.  You have a fever.  There is nausea or vomiting.  There is continued burning or discomfort with urination.

## 2015-12-17 ENCOUNTER — Encounter (HOSPITAL_BASED_OUTPATIENT_CLINIC_OR_DEPARTMENT_OTHER): Payer: Self-pay

## 2015-12-17 ENCOUNTER — Emergency Department (HOSPITAL_BASED_OUTPATIENT_CLINIC_OR_DEPARTMENT_OTHER)
Admission: EM | Admit: 2015-12-17 | Discharge: 2015-12-17 | Disposition: A | Discharge status: Home / Self Care | Payer: Self-pay | Attending: Emergency Medicine | Admitting: Emergency Medicine

## 2015-12-17 ENCOUNTER — Emergency Department (HOSPITAL_BASED_OUTPATIENT_CLINIC_OR_DEPARTMENT_OTHER): Payer: Self-pay

## 2015-12-17 DIAGNOSIS — Y999 Unspecified external cause status: Secondary | ICD-10-CM | POA: Insufficient documentation

## 2015-12-17 DIAGNOSIS — S2231XA Fracture of one rib, right side, initial encounter for closed fracture: Secondary | ICD-10-CM | POA: Insufficient documentation

## 2015-12-17 DIAGNOSIS — F17210 Nicotine dependence, cigarettes, uncomplicated: Secondary | ICD-10-CM | POA: Insufficient documentation

## 2015-12-17 DIAGNOSIS — Y939 Activity, unspecified: Secondary | ICD-10-CM | POA: Insufficient documentation

## 2015-12-17 DIAGNOSIS — Y929 Unspecified place or not applicable: Secondary | ICD-10-CM | POA: Insufficient documentation

## 2015-12-17 DIAGNOSIS — W109XXA Fall (on) (from) unspecified stairs and steps, initial encounter: Secondary | ICD-10-CM | POA: Insufficient documentation

## 2015-12-17 MED ORDER — OXYCODONE-ACETAMINOPHEN 5-325 MG PO TABS: 1.0000 | ORAL_TABLET | ORAL | Status: DC | PRN

## 2015-12-17 NOTE — ED Notes (Signed)
Right rib pain after falling down steps approx 1 week ago-NAD-steady gait

## 2015-12-17 NOTE — ED Notes (Signed)
Patient transported to X-ray 

## 2015-12-17 NOTE — ED Notes (Signed)
MD at bedside. 

## 2015-12-17 NOTE — ED Notes (Addendum)
Given ice pack

## 2015-12-17 NOTE — ED Notes (Signed)
Returned from xray

## 2015-12-17 NOTE — Discharge Instructions (Signed)

## 2015-12-29 NOTE — ED Provider Notes (Signed)
CSN: [id number]     Arrival date & time 12/17/15  1255 History   First MD Initiated Contact with Patient 12/17/15 1358     Chief Complaint  Patient presents with  . Rib Injury     (Consider location/radiation/quality/duration/timing/severity/associated sxs/prior Treatment) HPI   45yF with Chest pain. Right sided. Onset about a week ago. Patient was coming down some steps with a large, heavy laundry basket. She lost her balance and fell forward. She struck her right anterior chest against the edge of the basket. She said severe persistent pain since then. Worse from movement and breathing. Does not feel short of breath though. Has been taking over-the-counter pain medication with minimal improvement.  Past Medical History  Diagnosis Date  . Depression   . Urinary tract infection   . Fever blister   . Ovarian cancer [facility])    Past Surgical History  Procedure Laterality Date  . Abdominal hysterectomy    . Cesarean section     No family history on file. Social History  Substance Use Topics  . Smoking status: Current Every Day Smoker -- 0.50 packs/day    Types: Cigarettes  . Smokeless tobacco: None  . Alcohol Use: No   OB History    No data available     Review of Systems  All systems reviewed and negative, other than as noted in HPI.   Allergies  Review of patient's allergies indicates no known allergies.  Home Medications   Prior to Admission medications   Medication Sig Start Date End Date Taking? Authorizing Provider  ALPRAZolam Duanne Moron) 0.25 MG tablet Take 0.25 mg by mouth at bedtime as needed for anxiety.    Historical Provider, MD  gabapentin (NEURONTIN) 100 MG capsule Take 100 mg by mouth 3 (three) times daily.    Historical Provider, MD  [place]    Historical Provider, MD  oxyCODONE-acetaminophen (PERCOCET/ROXICET) 5-325 MG tablet Take 1 tablet by mouth every 4 (four) hours as needed for severe pain. 12/17/15   Virgel Manifold, MD   sertraline (ZOLOFT) 50 MG tablet Take 50 mg by mouth daily.    Historical Provider, MD  zolpidem (AMBIEN) 5 MG tablet Take 5 mg by mouth at bedtime as needed for sleep.    Historical Provider, MD   BP 141/118 mmHg  Pulse 78  Temp(Src) 97.7 F (36.5 C) (Oral)  Resp 18  Ht 5\' 3"  (1.6 m)  Wt 147 lb (66.679 kg)  BMI 26.05 kg/m2  SpO2 98% Physical Exam  Constitutional: She appears well-developed and well-nourished. No distress.  HENT:  Head: Normocephalic and atraumatic.  Eyes: Conjunctivae are normal. Right eye exhibits no discharge. Left eye exhibits no discharge.  Neck: Neck supple.  Cardiovascular: Normal rate, regular rhythm and normal heart sounds.  Exam reveals no gallop and no friction rub.   No murmur heard. Pulmonary/Chest: Effort normal and breath sounds normal. No respiratory distress. She exhibits tenderness.  Tenderness R chest wall laterally and anteriorly. No crepitus.   Abdominal: Soft. She exhibits no distension. There is no tenderness.  Musculoskeletal: She exhibits no edema or tenderness.  Neurological: She is alert.  Skin: Skin is warm and dry.  Psychiatric: She has a normal mood and affect. Her behavior is normal. Thought content normal.  Nursing note and vitals reviewed.   ED Course  Procedures (including critical care time) Labs Review Labs Reviewed - No data to display  Imaging Review No results found.   Dg Ribs Unilateral W/chest Right  12/17/2015  CLINICAL DATA:  Fall last week.  Right lower lateral rib pain. EXAM: RIGHT RIBS AND CHEST - 3+ VIEW COMPARISON:  09/11/2015 FINDINGS: No pneumothorax or pleural effusion. Subtle deformities of the right anterolateral fifth, sixth, and seventh ribs. The lungs appear clear. Cardiac and mediastinal margins appear normal. IMPRESSION: 1. Subtle irregularities of the right anterolateral fifth, sixth, and seventh ribs, suspicious for nondisplaced fractures. No pneumothorax or pleural effusion. Otherwise negative.  Electronically Signed   By: Van Clines M.D.   On: 12/17/2015 13:41   I have personally reviewed and evaluated these images and lab results as part of my medical decision-making.   EKG Interpretation None      MDM   Final diagnoses:  Rib fractures, right, closed, initial encounter    45yF with R rib fxs. No respiratory distress. Incentive spirometry. PRN pain meds. It has been determined that no acute conditions requiring further emergency intervention are present at this time. The patient has been advised of the diagnosis and plan. I reviewed any labs and imaging including any potential incidental findings. We have discussed signs and symptoms that warrant return to the ED and they are listed in the discharge instructions.      Virgel Manifold, MD 12/29/15 [phone]

## 2016-01-30 ENCOUNTER — Encounter (HOSPITAL_BASED_OUTPATIENT_CLINIC_OR_DEPARTMENT_OTHER): Payer: Self-pay | Admitting: *Deleted

## 2016-01-30 ENCOUNTER — Emergency Department (HOSPITAL_BASED_OUTPATIENT_CLINIC_OR_DEPARTMENT_OTHER)
Admission: EM | Admit: 2016-01-30 | Discharge: 2016-01-30 | Disposition: A | Discharge status: Home / Self Care | Payer: Self-pay | Attending: Emergency Medicine | Admitting: Emergency Medicine

## 2016-01-30 DIAGNOSIS — B001 Herpesviral vesicular dermatitis: Secondary | ICD-10-CM | POA: Insufficient documentation

## 2016-01-30 DIAGNOSIS — F17210 Nicotine dependence, cigarettes, uncomplicated: Secondary | ICD-10-CM | POA: Insufficient documentation

## 2016-01-30 DIAGNOSIS — F329 Major depressive disorder, single episode, unspecified: Secondary | ICD-10-CM | POA: Insufficient documentation

## 2016-01-30 DIAGNOSIS — Z8543 Personal history of malignant neoplasm of ovary: Secondary | ICD-10-CM | POA: Insufficient documentation

## 2016-01-30 MED ORDER — ACYCLOVIR 5 % EX OINT
1.0000 | TOPICAL_OINTMENT | CUTANEOUS | Status: AC
Start: 2016-01-30 — End: 2016-02-06

## 2016-01-30 MED ORDER — VALACYCLOVIR HCL 1 G PO TABS: 1000.0000 mg | ORAL_TABLET | Freq: Every day | ORAL | Status: AC

## 2016-01-30 NOTE — ED Provider Notes (Signed)
CSN: [id number]     Arrival date & time 01/30/16  1442 History   First MD Initiated Contact with Patient 01/30/16 1525     Chief Complaint  Patient presents with  . Fever Blister      (Consider location/radiation/quality/duration/timing/severity/associated sxs/prior Treatment) HPI   45 year old female with history of fever blister presents with complaint of fever blisters to her lips. Patient reports since yesterday she has noticed multiple fever blister involving both upper and lower lobes. It has increased in size, tense, with significant burning pain and pain is radiates to the jawline. Pain worsening with talking with palpation. She has had fever blister throughout her life at least twice yearly never this bad. She denies having any significant fever, headache, vision changes, trouble breathing through her nose, trouble swallowing, chest pressures of breath, vaginal rash or any other rash. She admits that she is under constant stress but not worsen. She is a smoker. Aside from using cool compress, Carmex and lip ointment no other specific treatment tried.  Past Medical History  Diagnosis Date  . Depression   . Urinary tract infection   . Fever blister   . Ovarian cancer [facility])    Past Surgical History  Procedure Laterality Date  . Abdominal hysterectomy    . Cesarean section     No family history on file. Social History  Substance Use Topics  . Smoking status: Current Every Day Smoker -- 0.50 packs/day    Types: Cigarettes  . Smokeless tobacco: None  . Alcohol Use: No   OB History    No data available     Review of Systems  Constitutional: Negative for fever.  HENT: Positive for mouth sores.   Skin: Positive for rash.      Allergies  Review of patient's allergies indicates no known allergies.  Home Medications   Prior to Admission medications   Medication Sig Start Date End Date Taking? Authorizing Provider  ALPRAZolam Duanne Moron) 0.25 MG tablet Take 0.25 mg by  mouth at bedtime as needed for anxiety.    Historical Provider, MD  gabapentin (NEURONTIN) 100 MG capsule Take 100 mg by mouth 3 (three) times daily.    Historical Provider, MD  [place]    Historical Provider, MD  oxyCODONE-acetaminophen (PERCOCET/ROXICET) 5-325 MG tablet Take 1 tablet by mouth every 4 (four) hours as needed for severe pain. 12/17/15   Virgel Manifold, MD  sertraline (ZOLOFT) 50 MG tablet Take 50 mg by mouth daily.    Historical Provider, MD  zolpidem (AMBIEN) 5 MG tablet Take 5 mg by mouth at bedtime as needed for sleep.    Historical Provider, MD   BP 106/81 mmHg  Pulse 77  Temp(Src) 98 F (36.7 C) (Oral)  Resp 20  Ht 5\' 3"  (1.6 m)  Wt 66.679 kg  BMI 26.05 kg/m2  SpO2 99% Physical Exam  Constitutional: She appears well-developed and well-nourished. No distress.  HENT:  Head: Atraumatic.  Multiple vesicular lesion noted to both upper and lower lips with associate swelling and tenderness to palpation. No other mucosal involvement or tendon involvement.  Eyes, ears, nose are normal.    Eyes: Conjunctivae are normal.  Neck: Neck supple.  Cardiovascular: Normal rate and regular rhythm.   Pulmonary/Chest: Effort normal and breath sounds normal.  Lymphadenopathy:    She has cervical adenopathy.  Neurological: She is alert.  Skin: Rash (lip rash) noted.  Psychiatric: She has a normal mood and affect.  Nursing note and vitals  reviewed.   ED Course  Procedures (including critical care time)   MDM   Final diagnoses:  Fever blister    BP 106/81 mmHg  Pulse 77  Temp(Src) 98 F (36.7 C) (Oral)  Resp 20  Ht 5\' 3"  (1.6 m)  Wt 66.679 kg  BMI 26.05 kg/m2  SpO2 99%   3:56 PM Patient here with fever blister involving the upper and lower lips. History exam but more intense today. 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 No diffuse disseminated herpes infection. She is afebrile. Plan to prescribe Zovirax ointment as  treatment. I will also prescribe Valtrex by mouth for patient to use if the symptoms persist.  Domenic Moras, PA-C 01/30/16 [place], MD 01/30/16 [phone]

## 2016-01-30 NOTE — ED Notes (Addendum)
Fever blister to her upper and lower lips.

## 2016-01-30 NOTE — Discharge Instructions (Signed)
Apply Zovirax ointment every 3 hours while awake for 7 days.  If there's no improvement, please take Valtrex 1 pill daily for 5 days as treatment.   Cold Sore A cold sore (fever blister) is a skin infection caused by a certain type of germ (virus). They are small sores filled with fluid that dry up and heal within 2 weeks. Cold sores form inside of the mouth or on the lips, gums, and other parts of the body. Cold sores can be easily passed (contagious) to other people. This can happen through close personal contact, such as kissing or sharing a drinking glass. HOME CARE  Only take medicine as told by your doctor. Do not use aspirin.  Use a cotton-tip swab to put creams or gels on your sores.  Do not touch sores or pick scabs. Wash your hands often. Do not touch your eyes without washing your hands first.  Avoid kissing, oral sex, and sharing personal items until the sores heal.  Put an ice pack on your sores for 10-15 minutes to ease discomfort.  Avoid hot, cold, or salty foods. Eat a soft, bland diet. Use a straw to drink if it helps lessen pain.  Keep sores clean and dry.  Avoid the sun and limit stress if these things cause you to have sores. Apply sunscreen on your lips if the sun causes cold sores. GET HELP IF:  You have a fever or lasting symptoms for more than 2-3 days.  You have a fever and your symptoms suddenly get worse.  You have yellow-white fluid (not clear) coming from the sores.  You have redness that is spreading.  You have pain or irritation in your eye.  You get sores on your genitals.  Your sores do not heal within 2 weeks.  You have a tough time fighting off sickness and infections (weakened immune system).  You get cold sores often. MAKE SURE YOU:   Understand these instructions.  Will watch your condition.  Will get help right away if you are not doing well or get worse.   This information is not intended to replace advice given to you by your  health care provider. Make sure you discuss any questions you have with your health care provider.   Document Released: 01/04/2012 Document Reviewed: 01/04/2012 Elsevier Interactive Patient Education [employer].

## 2016-02-01 DIAGNOSIS — F902 Attention-deficit hyperactivity disorder, combined type: Secondary | ICD-10-CM | POA: Insufficient documentation

## 2016-03-17 ENCOUNTER — Emergency Department (HOSPITAL_BASED_OUTPATIENT_CLINIC_OR_DEPARTMENT_OTHER)
Admission: EM | Admit: 2016-03-17 | Discharge: 2016-03-17 | Disposition: A | Discharge status: Home / Self Care | Payer: Self-pay | Attending: Emergency Medicine | Admitting: Emergency Medicine

## 2016-03-17 ENCOUNTER — Encounter (HOSPITAL_BASED_OUTPATIENT_CLINIC_OR_DEPARTMENT_OTHER): Payer: Self-pay

## 2016-03-17 ENCOUNTER — Emergency Department (HOSPITAL_BASED_OUTPATIENT_CLINIC_OR_DEPARTMENT_OTHER): Payer: Self-pay

## 2016-03-17 DIAGNOSIS — Z8543 Personal history of malignant neoplasm of ovary: Secondary | ICD-10-CM | POA: Insufficient documentation

## 2016-03-17 DIAGNOSIS — Z79899 Other long term (current) drug therapy: Secondary | ICD-10-CM | POA: Insufficient documentation

## 2016-03-17 DIAGNOSIS — F17210 Nicotine dependence, cigarettes, uncomplicated: Secondary | ICD-10-CM | POA: Insufficient documentation

## 2016-03-17 DIAGNOSIS — M7918 Myalgia, other site: Secondary | ICD-10-CM

## 2016-03-17 DIAGNOSIS — M79674 Pain in right toe(s): Secondary | ICD-10-CM | POA: Insufficient documentation

## 2016-03-17 MED ORDER — OXYCODONE-ACETAMINOPHEN 5-325 MG PO TABS: 1.0000 | ORAL_TABLET | Freq: Once | ORAL | Status: DC

## 2016-03-17 MED ORDER — OXYCODONE-ACETAMINOPHEN 5-325 MG PO TABS: 1.0000 | ORAL_TABLET | Freq: Three times a day (TID) | ORAL | 0 refills | Status: DC | PRN

## 2016-03-17 MED FILL — OXYCODONE/APAP 5-325: 5-325 | 2 days supply | Qty: 5 | Fill #0

## 2016-03-17 NOTE — ED Notes (Signed)
Patient transported to X-ray 
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
## 2016-03-17 NOTE — ED Notes (Signed)
Returned.  Given discharge instructions, applied wooden shoe and prescription was filled in our pharmacy.   States foot felt much better with shoe

## 2016-06-24 ENCOUNTER — Emergency Department (HOSPITAL_BASED_OUTPATIENT_CLINIC_OR_DEPARTMENT_OTHER)
Admission: EM | Admit: 2016-06-24 | Discharge: 2016-06-24 | Disposition: A | Discharge status: Home / Self Care | Payer: Self-pay | Attending: Emergency Medicine | Admitting: Emergency Medicine

## 2016-06-24 ENCOUNTER — Emergency Department (HOSPITAL_BASED_OUTPATIENT_CLINIC_OR_DEPARTMENT_OTHER): Payer: Self-pay

## 2016-06-24 ENCOUNTER — Encounter (HOSPITAL_BASED_OUTPATIENT_CLINIC_OR_DEPARTMENT_OTHER): Payer: Self-pay | Admitting: *Deleted

## 2016-06-24 DIAGNOSIS — Y939 Activity, unspecified: Secondary | ICD-10-CM | POA: Insufficient documentation

## 2016-06-24 DIAGNOSIS — M79671 Pain in right foot: Secondary | ICD-10-CM | POA: Insufficient documentation

## 2016-06-24 DIAGNOSIS — Y929 Unspecified place or not applicable: Secondary | ICD-10-CM | POA: Insufficient documentation

## 2016-06-24 DIAGNOSIS — Y999 Unspecified external cause status: Secondary | ICD-10-CM | POA: Insufficient documentation

## 2016-06-24 DIAGNOSIS — W540XXA Bitten by dog, initial encounter: Secondary | ICD-10-CM | POA: Insufficient documentation

## 2016-06-24 DIAGNOSIS — Z23 Encounter for immunization: Secondary | ICD-10-CM | POA: Insufficient documentation

## 2016-06-24 DIAGNOSIS — F17210 Nicotine dependence, cigarettes, uncomplicated: Secondary | ICD-10-CM | POA: Insufficient documentation

## 2016-06-24 MED ORDER — TETANUS-DIPHTH-ACELL PERTUSSIS 5-2.5-18.5 LF-MCG/0.5 IM SUSP
0.5000 mL | Freq: Once | INTRAMUSCULAR | Status: AC
  Administered 2016-06-24: 0.5 mL via INTRAMUSCULAR
  Filled 2016-06-24: qty 0.5

## 2016-06-24 MED ORDER — AMOXICILLIN-POT CLAVULANATE 875-125 MG PO TABS: 1.0000 | ORAL_TABLET | Freq: Two times a day (BID) | ORAL | 0 refills | Status: DC

## 2016-06-24 NOTE — ED Notes (Signed)
ED Provider at bedside. 

## 2016-06-24 NOTE — Discharge Instructions (Signed)
Medications: Augmentin  Treatment: Take Augmentin twice daily for 5 days to prevent infection. Continue washing your wounds with warm soapy water and apply antibiotic ointment such as Neosporin. Wear your hard soled shoe for support of your foot. Keep elevated and use ice 3-4 times daily alternating 20 minutes on, 20 minutes off.  Follow-up: Please follow-up with [facility] as needed if your symptoms are not improving over the next week. Please follow-up with Dr. Fredna Dow, a hand surgeon, for further evaluation and treatment of your numbness if it continues. Please return to emergency department if you develop any new or worsening symptoms.
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
## 2016-06-24 NOTE — ED Provider Notes (Signed)
[facility] DEPT Provider Note   CSN: [id number] Arrival date & time: 06/24/16  1750  By signing my name below, I, Emmanuella Mensah, attest that this documentation has been prepared under the direction and in the presence of .Marland Kitchen Electronically Signed: Judithann Sauger, ED Scribe. 06/24/16. 7:29 PM.   History   Chief Complaint Chief Complaint  Patient presents with  . Animal Bite    HPI Comments: Amber Sutton is a 45 y.o. female who presents to the Emergency Department complaining of multiple puncture wounds to her left palm, left arm, and right middle finger that occurred 4 days ago. She reports associated numbness to the tip of her left 3rd finger and intermittent moderate pain to the dorsum of her right foot concentrated around her great toe after hitting her foot on a stair railing during the incident described below. She describes pain as stabbing. She adds that she is unable to fully bend her great toe and she has pain with ambulating. She explains that she took her dog out to pee 4 days ago when another pit bull came to attack her dog and she was bit by the other dog as she was protecting her dog. She states that the dog's vaccinations are UTD and he is currently being quarantined. She reports that her tetanus vaccine is not UTD. No alleviating factors noted. She denies trying any specific medications for these symptoms, stating that she has just taken her daily medications. She has NKDA. She denies any fever, chills, nausea, vomiting, or any other symptoms.   The history is provided by the patient. No language interpreter was used.    Past Medical History:  Diagnosis Date  . Depression   . Fever blister   . Ovarian cancer ([place])   . Urinary tract infection     There are no active problems to display for this patient.   Past Surgical History:  Procedure Laterality Date  . ABDOMINAL HYSTERECTOMY    . CESAREAN SECTION      OB History    No data available       Home  Medications    Prior to Admission medications   Medication Sig Start Date End Date Taking? Authorizing Provider  ALPRAZolam Duanne Moron) 0.25 MG tablet Take 0.25 mg by mouth at bedtime as needed for anxiety.    Historical Provider, MD  amoxicillin-clavulanate (AUGMENTIN) 875-125 MG tablet Take 1 tablet by mouth every 12 (twelve) hours. 06/24/16   Frederica Kuster, PA-C  BuPROPion HCl (WELLBUTRIN PO) Take by mouth.    Historical Provider, MD  gabapentin (NEURONTIN) 100 MG capsule Take 100 mg by mouth 3 (three) times daily.    Historical Provider, MD  [place]    Historical Provider, MD  oxyCODONE-acetaminophen (PERCOCET/ROXICET) 5-325 MG tablet Take 1 tablet by mouth every 8 (eight) hours as needed for severe pain. 03/17/16   Shary Decamp, PA-C  sertraline (ZOLOFT) 50 MG tablet Take 50 mg by mouth daily.    Historical Provider, MD  zolpidem (AMBIEN) 5 MG tablet Take 5 mg by mouth at bedtime as needed for sleep.    Historical Provider, MD    Family History History reviewed. No pertinent family history.  Social History Social History  Substance Use Topics  . Smoking status: Current Every Day Smoker    Packs/day: 0.50    Types: Cigarettes  . Smokeless tobacco: Never Used  . Alcohol use No     Allergies   Patient has no known allergies.  Review of Systems Review of Systems  Constitutional: Negative for chills and fever.  HENT: Negative for facial swelling and sore throat.   Respiratory: Negative for shortness of breath.   Cardiovascular: Negative for chest pain.  Gastrointestinal: Negative for abdominal pain, nausea and vomiting.  Genitourinary: Negative for dysuria.  Musculoskeletal: Positive for arthralgias. Negative for back pain.  Skin: Positive for wound. Negative for rash.  Neurological: Positive for numbness. Negative for headaches.  Psychiatric/Behavioral: The patient is not nervous/anxious.      Physical Exam Updated Vital Signs BP 112/72    Pulse 96   Temp 98.2 F (36.8 C) (Oral)   Resp 18   Ht 5\' 3"  (1.6 m)   Wt 65.8 kg   SpO2 97%   BMI 25.69 kg/m   Physical Exam  Constitutional: She appears well-developed and well-nourished. No distress.  HENT:  Head: Normocephalic and atraumatic.  Mouth/Throat: Oropharynx is clear and moist. No oropharyngeal exudate.  Eyes: Conjunctivae are normal. Pupils are equal, round, and reactive to light. Right eye exhibits no discharge. Left eye exhibits no discharge. No scleral icterus.  Neck: Normal range of motion. Neck supple. No thyromegaly present.  Cardiovascular: Normal rate, regular rhythm, normal heart sounds and intact distal pulses.  Exam reveals no gallop and no friction rub.   No murmur heard. Pulmonary/Chest: Effort normal and breath sounds normal. No stridor. No respiratory distress. She has no wheezes. She has no rales.  Abdominal: Soft. Bowel sounds are normal. She exhibits no distension. There is no tenderness. There is no rebound and no guarding.  Musculoskeletal: She exhibits no edema.       Right hand: She exhibits normal range of motion, no tenderness, no bony tenderness, normal capillary refill and no swelling. Decreased sensation (3rd digit distal to DIP finger pad; decreased only, intact) noted. Normal strength noted.       Right foot: There is tenderness and bony tenderness. There is no swelling, normal capillary refill and no deformity.       Feet:  Lymphadenopathy:    She has no cervical adenopathy.  Neurological: She is alert. Coordination normal.  Skin: Skin is warm and dry. No rash noted. She is not diaphoretic. No pallor.  Small, well healing lacerations to the dorsum of the Right third digit and left wrist; mild erythema, but no lymphangitis, drainage, tenderness; mostly healed scratch to the palmar aspect of the R hand  Psychiatric: She has a normal mood and affect.  Nursing note and vitals reviewed.    ED Treatments / Results  DIAGNOSTIC  STUDIES: Oxygen Saturation is 97% on RA, adequate by my interpretation.    COORDINATION OF CARE: 7:28 PM- Pt advised of plan for treatment and pt agrees. Pt will receive right foot x-ray for further evaluation. She will also receive Tdap booster and antibiotics. Will provide resources to follow up with Podiatry as needed.    Labs (all labs ordered are listed, but only abnormal results are displayed) Labs Reviewed - No data to display  EKG  EKG Interpretation None       Radiology Dg Foot Complete Right  Result Date: 06/24/2016 CLINICAL DATA:  45 year old female status post blunt trauma to the right foot 4 days ago with pain. Initial encounter. EXAM: RIGHT FOOT COMPLETE - 3+ VIEW COMPARISON:  Right great toe series 03/17/16. FINDINGS: Suspect a healed fracture of the lateral aspect distal first phalanx shaft. Otherwise stable right first ray osseous structures. Other phalanges appear intact. No metatarsal fracture identified. No  tarsal bone fracture. Calcaneus intact. Joint spaces and alignment within normal limits. Distal tibia and fibula intact. IMPRESSION: No acute fracture or dislocation identified about the right foot. Electronically Signed   By: Genevie Ann M.D.   On: 06/24/2016 20:16    Procedures Procedures (including critical care time)  Medications Ordered in ED Medications  Tdap (BOOSTRIX) injection 0.5 mL (0.5 mLs Intramuscular Given 06/24/16 1939)     Initial Impression / Assessment and Plan / ED Course  Eliezer Mccoy, PA-C has reviewed the triage vital signs and the nursing notes.  Pertinent labs & imaging results that were available during my care of the patient were reviewed by me and considered in my medical decision making (see chart for details).  Clinical Course     Patient with well-healing wounds, but will cover with Augmentin considering mild erythema surrounding scab. Right foot x-ray negative. Patient has hard soled shoe at home. Patient to follow-up with  podiatry. Patient also to follow up with hand surgery if her paresthesias are not improving, however I suspect neuro apraxia should resolve on Sunday. One care discussed. Return precautions discussed. Patient understands and agrees with plan. Patient vitals stable throughout ED course and discharged in satisfactory condition.  Final Clinical Impressions(s) / ED Diagnoses   Final diagnoses:  Dog bite, initial encounter  Right foot pain    New Prescriptions Discharge Medication List as of 06/24/2016  8:41 PM    START taking these medications   Details  amoxicillin-clavulanate (AUGMENTIN) 875-125 MG tablet Take 1 tablet by mouth every 12 (twelve) hours., Starting Thu 06/24/2016, Print       I personally performed the services described in this documentation, which was scribed in my presence. The recorded information has been reviewed and is accurate.    Frederica Kuster, PA-C 06/25/16 1719    Gareth Morgan, MD 07/01/16 2126

## 2016-06-30 ENCOUNTER — Encounter: Payer: Self-pay | Admitting: Family Medicine

## 2016-06-30 ENCOUNTER — Ambulatory Visit (INDEPENDENT_AMBULATORY_CARE_PROVIDER_SITE_OTHER): Payer: Self-pay | Admitting: Family Medicine

## 2016-06-30 DIAGNOSIS — S6991XA Unspecified injury of right wrist, hand and finger(s), initial encounter: Secondary | ICD-10-CM

## 2016-06-30 DIAGNOSIS — M79674 Pain in right toe(s): Secondary | ICD-10-CM

## 2016-06-30 MED ORDER — DICLOFENAC SODIUM 75 MG PO TBEC: 75.0000 mg | DELAYED_RELEASE_TABLET | Freq: Two times a day (BID) | ORAL | 1 refills | Status: DC

## 2016-06-30 NOTE — Patient Instructions (Signed)
The digital nerve of your finger was damaged by the bite. These usually take months to recover - there's no guarantee you will recover all of your sensation but the function of your finger will be fine. There's nothing you can do to accelerate this process.  You have an extensor tendinitis of your foot. Wear boot as often as possible until I see you back. Icing 15 minutes at a time 3-4 times a day. Diclofenac 75mg  twice a day with food for pain and inflammation. Follow up with me just after new years for reevaluation.

## 2016-07-06 DIAGNOSIS — M79674 Pain in right toe(s): Secondary | ICD-10-CM | POA: Insufficient documentation

## 2016-07-06 DIAGNOSIS — S6991XA Unspecified injury of right wrist, hand and finger(s), initial encounter: Secondary | ICD-10-CM | POA: Insufficient documentation

## 2016-07-06 NOTE — Assessment & Plan Note (Signed)
Right 3rd digit puncture wound - damage to digital nerve.  Discussed uncertain about if sensation will come back fully but will take months to recover if this does.

## 2016-07-06 NOTE — Progress Notes (Signed)
PCP: No PCP Per Patient  Subjective:   HPI: Patient is a 45 y.o. female here for right finger numbness, right foot pain.  Patient reports she was bit by a dog on 12/6 on right middle finger. Dog was up to date on shots including rabies. Had puncture wound - treated with augmentin but continues to have numbness on ulnar side of this finger. No pain here now, motion is still good. Her right toe is also bothering her. Pain on dorsal side of the great toe - pain 8/10, sharp. Difficulty bending this. Does not recall an injury though possible she kicked something when the dog bit her. No skin changes, numbness of toe.  Past Medical History:  Diagnosis Date  . Depression   . Fever blister   . Ovarian cancer ([place])   . Urinary tract infection     Current Outpatient Prescriptions on File Prior to Visit  Medication Sig Dispense Refill  . amoxicillin-clavulanate (AUGMENTIN) 875-125 MG tablet Take 1 tablet by mouth every 12 (twelve) hours. 10 tablet 0  . OVER THE COUNTER MEDICATION Goodie Powders     No current facility-administered medications on file prior to visit.     Past Surgical History:  Procedure Laterality Date  . ABDOMINAL HYSTERECTOMY    . CESAREAN SECTION      No Known Allergies  Social History   Social History  . Marital status: Single    Spouse name: N/A  . Number of children: N/A  . Years of education: N/A   Occupational History  . Not on file.   Social History Main Topics  . Smoking status: Current Every Day Smoker    Packs/day: 0.50    Types: Cigarettes  . Smokeless tobacco: Never Used  . Alcohol use No  . Drug use: No  . Sexual activity: Not on file   Other Topics Concern  . Not on file   Social History Narrative  . No narrative on file    No family history on file.  BP 118/88   Pulse 88   Ht 5\' 3"  (1.6 m)   Wt 145 lb (65.8 kg)   BMI 25.69 kg/m   Review of Systems: See HPI above.     Objective:  Physical Exam:  Gen: NAD,  comfortable in exam room  Right 3rd finger: Small puncture wound ulnar side 3rd digit proximal phalanx area.  No swelling, bruising, redness, other deformity. FROM MCP, PIP, DIP joints with 5/5 strength. Collateral ligaments intact. Sensation diminished ulnar side of digit from puncture wound distally.  Right foot: No gross deformity, swelling, bruising. TTP throughout great toe dorsally.  No other tenderness of foot. Difficulty with flexion and extension of 1st digit. Collateral ligaments intact great toe IP and MTP joints. NVI distally.  MSK u/s:  Extensor and flexor tendons of right great toe are intact.  No evidence fracture either.  Assessment & Plan:  1. Right great toe pain - 2/2 extensor tendinitis.  Reassured regarding radiographs and ultrasound.  Boot to help rest this.  Icing, diclofenac twice a day.  F/u just after new years.  2. Right 3rd digit puncture wound - damage to digital nerve.  Discussed uncertain about if sensation will come back fully but will take months to recover if this does.

## 2016-07-06 NOTE — Assessment & Plan Note (Signed)
2/2 extensor tendinitis.  Reassured regarding radiographs and ultrasound.  Boot to help rest this.  Icing, diclofenac twice a day.  F/u just after new years.

## 2016-07-10 ENCOUNTER — Emergency Department (HOSPITAL_BASED_OUTPATIENT_CLINIC_OR_DEPARTMENT_OTHER): Payer: Self-pay

## 2016-07-10 ENCOUNTER — Emergency Department (HOSPITAL_BASED_OUTPATIENT_CLINIC_OR_DEPARTMENT_OTHER)
Admission: EM | Admit: 2016-07-10 | Discharge: 2016-07-10 | Disposition: A | Discharge status: Home / Self Care | Payer: Self-pay | Attending: Emergency Medicine | Admitting: Emergency Medicine

## 2016-07-10 ENCOUNTER — Encounter (HOSPITAL_BASED_OUTPATIENT_CLINIC_OR_DEPARTMENT_OTHER): Payer: Self-pay | Admitting: Emergency Medicine

## 2016-07-10 DIAGNOSIS — Z8543 Personal history of malignant neoplasm of ovary: Secondary | ICD-10-CM | POA: Insufficient documentation

## 2016-07-10 DIAGNOSIS — Z79899 Other long term (current) drug therapy: Secondary | ICD-10-CM | POA: Insufficient documentation

## 2016-07-10 DIAGNOSIS — F909 Attention-deficit hyperactivity disorder, unspecified type: Secondary | ICD-10-CM | POA: Insufficient documentation

## 2016-07-10 DIAGNOSIS — F17210 Nicotine dependence, cigarettes, uncomplicated: Secondary | ICD-10-CM | POA: Insufficient documentation

## 2016-07-10 DIAGNOSIS — K439 Ventral hernia without obstruction or gangrene: Secondary | ICD-10-CM | POA: Insufficient documentation

## 2016-07-10 LAB — COMPREHENSIVE METABOLIC PANEL
ALK PHOS: 74 U/L (ref 38–126)
ALT: 16 U/L (ref 14–54)
ANION GAP: 10 (ref 5–15)
AST: 24 U/L (ref 15–41)
Albumin: 4 g/dL (ref 3.5–5.0)
BUN: 16 mg/dL (ref 6–20)
CALCIUM: 8.8 mg/dL — AB (ref 8.9–10.3)
CHLORIDE: 105 mmol/L (ref 101–111)
CO2: 22 mmol/L (ref 22–32)
Creatinine, Ser: 0.99 mg/dL (ref 0.44–1.00)
GFR calc non Af Amer: >60 mL/min (ref >60)
Glucose, Bld: 101 mg/dL — ABNORMAL HIGH (ref 65–99)
Potassium: 3.5 mmol/L (ref 3.5–5.1)
SODIUM: 137 mmol/L (ref 135–145)
Total Bilirubin: 0.3 mg/dL (ref 0.3–1.2)
Total Protein: 7.4 g/dL (ref 6.5–8.1)

## 2016-07-10 LAB — CBC WITH DIFFERENTIAL/PLATELET
Basophils Absolute: 0 10*3/uL (ref 0.0–0.1)
Basophils Relative: 0 %
EOS ABS: 0 10*3/uL (ref 0.0–0.7)
Eosinophils Relative: 0 %
HCT: 39.1 % (ref 36.0–46.0)
HEMOGLOBIN: 13.2 g/dL (ref 12.0–15.0)
LYMPHS ABS: 0.9 10*3/uL (ref 0.7–4.0)
Lymphocytes Relative: 16 %
MCH: 30.9 pg (ref 26.0–34.0)
MCHC: 33.8 g/dL (ref 30.0–36.0)
MCV: 91.6 fL (ref 78.0–100.0)
Monocytes Absolute: 0.7 10*3/uL (ref 0.1–1.0)
Monocytes Relative: 12 %
NEUTROS PCT: 72 %
Neutro Abs: 4 10*3/uL (ref 1.7–7.7)
Platelets: 212 10*3/uL (ref 150–400)
RBC: 4.27 MIL/uL (ref 3.87–5.11)
RDW: 12.6 % (ref 11.5–15.5)
WBC: 5.5 10*3/uL (ref 4.0–10.5)

## 2016-07-10 LAB — URINALYSIS, ROUTINE W REFLEX MICROSCOPIC
GLUCOSE, UA: NEGATIVE mg/dL
Hgb urine dipstick: NEGATIVE
Ketones, ur: 15 mg/dL — AB
LEUKOCYTES UA: NEGATIVE
Nitrite: NEGATIVE
PH: 5.5 (ref 5.0–8.0)
Protein, ur: 30 mg/dL — AB
SPECIFIC GRAVITY, URINE: 1.027 (ref 1.005–1.030)

## 2016-07-10 LAB — URINALYSIS, MICROSCOPIC (REFLEX): RBC / HPF: NONE SEEN RBC/hpf (ref 0–5)

## 2016-07-10 LAB — LIPASE, BLOOD: Lipase: 25 U/L (ref 11–51)

## 2016-07-10 MED ORDER — HYDROCODONE-ACETAMINOPHEN 5-325 MG PO TABS: 1.0000 | ORAL_TABLET | ORAL | 0 refills | Status: DC | PRN

## 2016-07-10 MED ORDER — HYDROMORPHONE HCL 1 MG/ML IJ SOLN
1.0000 mg | Freq: Once | INTRAMUSCULAR | Status: AC
  Administered 2016-07-10: 1 mg via INTRAVENOUS
  Filled 2016-07-10: qty 1

## 2016-07-10 MED ORDER — IOPAMIDOL (ISOVUE-300) INJECTION 61%
100.0000 mL | Freq: Once | INTRAVENOUS | Status: AC | PRN
  Administered 2016-07-10: 100 mL via INTRAVENOUS

## 2016-07-10 MED ORDER — SODIUM CHLORIDE 0.9 % IV BOLUS (SEPSIS)
1000.0000 mL | Freq: Once | INTRAVENOUS | Status: AC
  Administered 2016-07-10: 1000 mL via INTRAVENOUS

## 2016-07-10 MED ORDER — ONDANSETRON HCL 4 MG/2ML IJ SOLN
4.0000 mg | Freq: Once | INTRAMUSCULAR | Status: AC
  Administered 2016-07-10: 4 mg via INTRAVENOUS
  Filled 2016-07-10: qty 2

## 2016-07-10 MED ORDER — DOCUSATE SODIUM 100 MG PO CAPS: 100.0000 mg | ORAL_CAPSULE | Freq: Two times a day (BID) | ORAL | 0 refills | Status: DC

## 2016-07-10 MED ORDER — FAMOTIDINE IN NACL 20-0.9 MG/50ML-% IV SOLN
20.0000 mg | Freq: Once | INTRAVENOUS | Status: AC
  Administered 2016-07-10: 20 mg via INTRAVENOUS
  Filled 2016-07-10: qty 50

## 2016-07-10 MED ORDER — MORPHINE SULFATE (PF) 4 MG/ML IV SOLN
4.0000 mg | Freq: Once | INTRAVENOUS | Status: AC
  Administered 2016-07-10: 4 mg via INTRAVENOUS
  Filled 2016-07-10: qty 1

## 2016-07-10 NOTE — ED Provider Notes (Signed)
[facility] DEPT [facility] Provider Note   CSN: [id number] Arrival date & time: 07/10/16  1140     History   Chief Complaint Chief Complaint  Patient presents with  . Abdominal Pain    HPI Amber Sutton is a 45 y.o. female.  HPI Patient started developing epigastric pain yesterday. She reports it has become severe, intense aching. She is expressing nausea but no vomiting. Pain burning or urgency as urination. No diarrhea or constipation. No history of similar pain. No radiation to the back. Cough or shortness of breath. No alcohol use. No history of gastritis or peptic ulcer disease. Patient poor she last ate a bowl of cereal. Past Medical History:  Diagnosis Date  . Depression   . Fever blister   . Ovarian cancer ([place])   . Urinary tract infection     Patient Active Problem List   Diagnosis Date Noted  . Great toe pain, right 07/06/2016  . Finger injury, right, initial encounter 07/06/2016  . ADHD (attention deficit hyperactivity disorder), combined type 02/01/2016  . Major depressive disorder, recurrent episode ([place]) 12/23/2012  . Panic disorder 12/23/2012    Past Surgical History:  Procedure Laterality Date  . ABDOMINAL HYSTERECTOMY    . CESAREAN SECTION      OB History    No data available       Home Medications    Prior to Admission medications   Medication Sig Start Date End Date Taking? Authorizing Provider  ALPRAZolam Duanne Moron) 1 MG tablet Take 1 mg by mouth 3 (three) times daily as needed. 06/07/16  Yes Historical Provider, MD  buPROPion (WELLBUTRIN XL) 300 MG 24 hr tablet Take 300 mg by mouth every morning. 06/23/16  Yes Historical Provider, MD  gabapentin (NEURONTIN) 300 MG capsule TAKE 1 CAPSULE (300 MG TOTAL) BY MOUTH THREE (3) TIMES A DAY. 05/17/16  Yes Historical Provider, MD  sertraline (ZOLOFT) 100 MG tablet TAKE 2 TABLETS BY MOUTH ONCE DIALY 06/16/16  Yes Historical Provider, MD  amoxicillin-clavulanate (AUGMENTIN) 875-125 MG tablet Take 1 tablet by  mouth every 12 (twelve) hours. 06/24/16   Frederica Kuster, PA-C  diclofenac (VOLTAREN) 75 MG EC tablet Take 1 tablet (75 mg total) by mouth 2 (two) times daily. 06/30/16   Dene Gentry, MD  docusate sodium (COLACE) 100 MG capsule Take 1 capsule (100 mg total) by mouth every 12 (twelve) hours. Take to avoid developing constipation and straining at stool. 07/10/16   Charlesetta Shanks, MD  HYDROcodone-acetaminophen (NORCO/VICODIN) 5-325 MG tablet Take 1-2 tablets by mouth every 4 (four) hours as needed for moderate pain or severe pain. 07/10/16   Charlesetta Shanks, MD  OVER THE COUNTER MEDICATION Goodie Powders    Historical Provider, MD  zolpidem (AMBIEN) 10 MG tablet TAKE 1 AND 1/2 TABLET EVERY NIGHT 06/23/16   Historical Provider, MD    Family History No family history on file.  Social History Social History  Substance Use Topics  . Smoking status: Current Every Day Smoker    Packs/day: 0.50    Types: Cigarettes  . Smokeless tobacco: Never Used  . Alcohol use No     Allergies   Patient has no known allergies.   Review of Systems Review of Systems  10 Systems reviewed and are negative for acute change except as noted in the HPI.  Physical Exam Updated Vital Signs BP 120/83 (BP Location: Right Arm)   Pulse 71   Temp 98.4 F (36.9 C) (Oral)   Resp 16   Ht 5'  3" (1.6 m)   Wt 155 lb (70.3 kg)   SpO2 96%   BMI 27.46 kg/m   Physical Exam  Constitutional: She is oriented to person, place, and time. She appears well-developed and well-nourished. No distress.  HENT:  Head: Normocephalic and atraumatic.  Mouth/Throat: Oropharynx is clear and moist.  Eyes: Conjunctivae are normal.  Neck: Neck supple.  Cardiovascular: Normal rate and regular rhythm.   No murmur heard. Pulmonary/Chest: Effort normal and breath sounds normal. No respiratory distress.  Abdominal: Soft. There is tenderness.  Severe epigastric tenderness.  Musculoskeletal: She exhibits no edema.  Neurological: She  is alert and oriented to person, place, and time. No cranial nerve deficit. She exhibits normal muscle tone. Coordination normal.  Skin: Skin is warm and dry.  Psychiatric: She has a normal mood and affect.  Nursing note and vitals reviewed.    ED Treatments / Results  Labs (all labs ordered are listed, but only abnormal results are displayed) Labs Reviewed  URINALYSIS, ROUTINE W REFLEX MICROSCOPIC - Abnormal; Notable for the following:       Result Value   APPearance CLOUDY (*)    Bilirubin Urine SMALL (*)    Ketones, ur 15 (*)    Protein, ur 30 (*)    All other components within normal limits  URINALYSIS, MICROSCOPIC (REFLEX) - Abnormal; Notable for the following:    Bacteria, UA MANY (*)    Squamous Epithelial / LPF 6-30 (*)    All other components within normal limits  COMPREHENSIVE METABOLIC PANEL - Abnormal; Notable for the following:    Glucose, Bld 101 (*)    Calcium 8.8 (*)    All other components within normal limits  LIPASE, BLOOD  CBC WITH DIFFERENTIAL/PLATELET    EKG  EKG Interpretation None       Radiology Ct Abdomen Pelvis W Contrast  Result Date: 07/10/2016 CLINICAL DATA:  Abdominal pain and nausea EXAM: CT ABDOMEN AND PELVIS WITH CONTRAST TECHNIQUE: Multidetector CT imaging of the abdomen and pelvis was performed using the standard protocol following bolus administration of intravenous contrast. CONTRAST:  178mL ISOVUE-300 IOPAMIDOL (ISOVUE-300) INJECTION 61% COMPARISON:  None. FINDINGS: Lower chest: No acute abnormality. Hepatobiliary: No focal liver abnormality is seen. No gallstones, gallbladder wall thickening, or biliary dilatation. Pancreas: Unremarkable. No pancreatic ductal dilatation or surrounding inflammatory changes. Spleen: Normal in size without focal abnormality. Adrenals/Urinary Tract: The adrenal glands are within normal limits. The kidneys show no evidence of renal calculi or obstructive changes bilaterally. The bladder is well distended.  Stomach/Bowel: Appendix is within normal limits. No obstructive changes are seen. No inflammatory changes are noted. Vascular/Lymphatic: No significant vascular findings are present. No enlarged abdominal or pelvic lymph nodes. Reproductive: Status post hysterectomy. No adnexal masses. Other: No ascites is identified. There is a supraumbilical anterior abdominal wall hernia in the midline with a neck measuring 1.4 cm. This contains only omental fat however a significant amount of inflammatory change surrounding the hernia is noted. This likely is the etiology of the patient's underlying discomfort. Musculoskeletal: Degenerative change of the lumbar spine is noted. IMPRESSION: Supraumbilical midline abdominal wall hernia with significant inflammatory changes. Electronically Signed   By: Inez Catalina M.D.   On: 07/10/2016 15:28    Procedures Procedures (including critical care time) Hernia reduction: Patient was given 1 mg IV of Dilaudid. With direct pressure hernia reduced. Medications Ordered in ED Medications  morphine 4 MG/ML injection 4 mg (4 mg Intravenous Given 07/10/16 1411)  ondansetron (ZOFRAN) injection 4 mg (  4 mg Intravenous Given 07/10/16 1412)  famotidine (PEPCID) IVPB 20 mg premix (0 mg Intravenous Stopped 07/10/16 1525)  sodium chloride 0.9 % bolus 1,000 mL (0 mLs Intravenous Stopped 07/10/16 1627)  iopamidol (ISOVUE-300) 61 % injection 100 mL (100 mLs Intravenous Contrast Given 07/10/16 1503)  HYDROmorphone (DILAUDID) injection 1 mg (1 mg Intravenous Given 07/10/16 1555)     Initial Impression / Assessment and Plan / ED Course  I have reviewed the triage vital signs and the nursing notes.  Pertinent labs & imaging results that were available during my care of the patient were reviewed by me and considered in my medical decision making (see chart for details).  Clinical Course       Final Clinical Impressions(s) / ED Diagnoses   Final diagnoses:  Abdominal wall hernia    Patient has a small abdominal wall hernia with fat contained. This was reproducible with direct pressure. Patient did have severe pain however prior to reduction. Also some residual aching discomfort. Patient will be given pain medication is counseled on maintaining direct pressure on the abdominal wall with coughing or any specific movements. She is counseled on no lifting or straining. Surgical referral number is provided. New Prescriptions Discharge Medication List as of 07/10/2016  4:09 PM    START taking these medications   Details  docusate sodium (COLACE) 100 MG capsule Take 1 capsule (100 mg total) by mouth every 12 (twelve) hours. Take to avoid developing constipation and straining at stool., Starting Sat 07/10/2016, Print    HYDROcodone-acetaminophen (NORCO/VICODIN) 5-325 MG tablet Take 1-2 tablets by mouth every 4 (four) hours as needed for moderate pain or severe pain., Starting Sat 07/10/2016, Print         Charlesetta Shanks, MD 07/10/16 [phone]

## 2016-07-10 NOTE — ED Notes (Signed)
ED Provider at bedside. 

## 2016-07-10 NOTE — ED Notes (Signed)
Pt given d/c instructions as per chart. Rx x 2 with precautions. Verbalizes understanding. No questions. 

## 2016-07-10 NOTE — ED Triage Notes (Signed)
Upper mid abd pain since yesterday, endorses nausea, no vomiting, denies urinary symptoms.

## 2016-07-21 ENCOUNTER — Telehealth: Payer: Self-pay | Admitting: Family Medicine

## 2016-07-21 NOTE — Telephone Encounter (Signed)
Is the diclofenac not working?  I'd see if she can bump her appointment up to tomorrow to recheck her.  We could try a different nsaid if diclofenac isn't working.  She can take tylenol and a topical medication in addition to diclofenac also.

## 2016-07-21 NOTE — Telephone Encounter (Deleted)
Spoke to patient and needed a different office for an appointment.

## 2016-07-22 ENCOUNTER — Ambulatory Visit: Payer: Self-pay | Admitting: Family Medicine

## 2016-07-22 NOTE — Telephone Encounter (Signed)
Spoke to patient on 07-21-16, called wrong office.

## 2016-07-23 ENCOUNTER — Ambulatory Visit: Payer: Self-pay | Admitting: Family Medicine

## 2016-10-01 ENCOUNTER — Emergency Department (HOSPITAL_BASED_OUTPATIENT_CLINIC_OR_DEPARTMENT_OTHER)
Admission: EM | Admit: 2016-10-01 | Discharge: 2016-10-01 | Disposition: A | Discharge status: Home / Self Care | Payer: Self-pay | Attending: Emergency Medicine | Admitting: Emergency Medicine

## 2016-10-01 ENCOUNTER — Encounter (HOSPITAL_BASED_OUTPATIENT_CLINIC_OR_DEPARTMENT_OTHER): Payer: Self-pay | Admitting: *Deleted

## 2016-10-01 DIAGNOSIS — F1290 Cannabis use, unspecified, uncomplicated: Secondary | ICD-10-CM | POA: Insufficient documentation

## 2016-10-01 DIAGNOSIS — A084 Viral intestinal infection, unspecified: Secondary | ICD-10-CM | POA: Insufficient documentation

## 2016-10-01 DIAGNOSIS — Z8543 Personal history of malignant neoplasm of ovary: Secondary | ICD-10-CM | POA: Insufficient documentation

## 2016-10-01 DIAGNOSIS — Z79899 Other long term (current) drug therapy: Secondary | ICD-10-CM | POA: Insufficient documentation

## 2016-10-01 DIAGNOSIS — F902 Attention-deficit hyperactivity disorder, combined type: Secondary | ICD-10-CM | POA: Insufficient documentation

## 2016-10-01 DIAGNOSIS — F17210 Nicotine dependence, cigarettes, uncomplicated: Secondary | ICD-10-CM | POA: Insufficient documentation

## 2016-10-01 LAB — CBC WITH DIFFERENTIAL/PLATELET
BASOS ABS: 0 10*3/uL (ref 0.0–0.1)
BASOS PCT: 0 %
Eosinophils Absolute: 0.2 10*3/uL (ref 0.0–0.7)
Eosinophils Relative: 3 %
HEMATOCRIT: 42.5 % (ref 36.0–46.0)
Hemoglobin: 14.6 g/dL (ref 12.0–15.0)
Lymphocytes Relative: 23 %
Lymphs Abs: 1.7 10*3/uL (ref 0.7–4.0)
MCH: 30 pg (ref 26.0–34.0)
MCHC: 34.4 g/dL (ref 30.0–36.0)
MCV: 87.4 fL (ref 78.0–100.0)
MONOS PCT: 10 %
Monocytes Absolute: 0.7 10*3/uL (ref 0.1–1.0)
NEUTROS ABS: 4.7 10*3/uL (ref 1.7–7.7)
NEUTROS PCT: 64 %
Platelets: 258 10*3/uL (ref 150–400)
RBC: 4.86 MIL/uL (ref 3.87–5.11)
RDW: 14.1 % (ref 11.5–15.5)
WBC: 7.4 10*3/uL (ref 4.0–10.5)

## 2016-10-01 LAB — BASIC METABOLIC PANEL
ANION GAP: 9 (ref 5–15)
BUN: 19 mg/dL (ref 6–20)
CHLORIDE: 105 mmol/L (ref 101–111)
CO2: 22 mmol/L (ref 22–32)
Calcium: 9.1 mg/dL (ref 8.9–10.3)
Creatinine, Ser: 0.99 mg/dL (ref 0.44–1.00)
GFR calc Af Amer: >60 mL/min (ref >60)
GFR calc non Af Amer: >60 mL/min (ref >60)
Glucose, Bld: 104 mg/dL — ABNORMAL HIGH (ref 65–99)
Potassium: 4.1 mmol/L (ref 3.5–5.1)
Sodium: 136 mmol/L (ref 135–145)

## 2016-10-01 LAB — URINALYSIS, MICROSCOPIC (REFLEX): RBC / HPF: NONE SEEN RBC/hpf (ref 0–5)

## 2016-10-01 LAB — URINALYSIS, ROUTINE W REFLEX MICROSCOPIC
Bilirubin Urine: NEGATIVE
Glucose, UA: NEGATIVE mg/dL
Hgb urine dipstick: NEGATIVE
KETONES UR: 15 mg/dL — AB
NITRITE: NEGATIVE
Protein, ur: NEGATIVE mg/dL
Specific Gravity, Urine: 1.023 (ref 1.005–1.030)
pH: 7 (ref 5.0–8.0)

## 2016-10-01 MED ORDER — ONDANSETRON HCL 4 MG PO TABS: 4.0000 mg | ORAL_TABLET | Freq: Four times a day (QID) | ORAL | 0 refills | Status: DC | PRN

## 2016-10-01 MED ORDER — SODIUM CHLORIDE 0.9 % IV BOLUS (SEPSIS)
1000.0000 mL | Freq: Once | INTRAVENOUS | Status: AC
  Administered 2016-10-01: 1000 mL via INTRAVENOUS

## 2016-10-01 MED ORDER — ONDANSETRON HCL 4 MG/2ML IJ SOLN
4.0000 mg | Freq: Once | INTRAMUSCULAR | Status: AC
  Administered 2016-10-01: 4 mg via INTRAVENOUS

## 2016-10-01 NOTE — Discharge Instructions (Signed)
Please read instructions below. Talk with your primary care provider about any new medications. Return for vomiting blood, blood in your stool, increasing symptoms.

## 2016-10-01 NOTE — ED Provider Notes (Signed)
[facility] Provider Note   CSN: [id number] Arrival date & time: 10/01/16  1019     History   Chief Complaint Chief Complaint  Patient presents with  . Nausea    HPI Amber Sutton is a 46 y.o. female.  Pt presents w nausea since Monday, accompanied w vomiting and diarrhea. Reports vomiting on Monday and Tues and diarrhea yesterday and today. Reports nausea has been constant since Monday w decreased appetite. Denies blood in stool, hematemesis, new foods or medications, recent travel, drinking from misc water sources. Reports hx abdl hernia, no current assoc sx.       Past Medical History:  Diagnosis Date  . Depression   . Fever blister   . Ovarian cancer ([place])   . Urinary tract infection     Patient Active Problem List   Diagnosis Date Noted  . Great toe pain, right 07/06/2016  . Finger injury, right, initial encounter 07/06/2016  . ADHD (attention deficit hyperactivity disorder), combined type 02/01/2016  . Major depressive disorder, recurrent episode ([place]) 12/23/2012  . Panic disorder 12/23/2012    Past Surgical History:  Procedure Laterality Date  . ABDOMINAL HYSTERECTOMY    . CESAREAN SECTION      OB History    No data available       Home Medications    Prior to Admission medications   Medication Sig Start Date End Date Taking? Authorizing Provider  ALPRAZolam Duanne Moron) 1 MG tablet Take 1 mg by mouth 3 (three) times daily as needed. 06/07/16  Yes Historical Provider, MD  buPROPion (WELLBUTRIN XL) 300 MG 24 hr tablet Take 300 mg by mouth every morning. 06/23/16  Yes Historical Provider, MD  gabapentin (NEURONTIN) 300 MG capsule TAKE 1 CAPSULE (300 MG TOTAL) BY MOUTH THREE (3) TIMES A DAY. 05/17/16  Yes Historical Provider, MD  sertraline (ZOLOFT) 100 MG tablet TAKE 2 TABLETS BY MOUTH ONCE DIALY 06/16/16  Yes Historical Provider, MD  amoxicillin-clavulanate (AUGMENTIN) 875-125 MG tablet Take 1 tablet by mouth every 12 (twelve) hours. 06/24/16    Frederica Kuster, PA-C  diclofenac (VOLTAREN) 75 MG EC tablet Take 1 tablet (75 mg total) by mouth 2 (two) times daily. 06/30/16   Dene Gentry, MD  docusate sodium (COLACE) 100 MG capsule Take 1 capsule (100 mg total) by mouth every 12 (twelve) hours. Take to avoid developing constipation and straining at stool. 07/10/16   Charlesetta Shanks, MD  HYDROcodone-acetaminophen (NORCO/VICODIN) 5-325 MG tablet Take 1-2 tablets by mouth every 4 (four) hours as needed for moderate pain or severe pain. 07/10/16   Charlesetta Shanks, MD  ondansetron (ZOFRAN) 4 MG tablet Take 1 tablet (4 mg total) by mouth every 6 (six) hours as needed for nausea or vomiting. 10/01/16   [place] Nicole Russo, PA-C  OVER THE COUNTER MEDICATION Goodie Powders    Historical Provider, MD  zolpidem (AMBIEN) 10 MG tablet TAKE 1 AND 1/2 TABLET EVERY NIGHT 06/23/16   Historical Provider, MD    Family History No family history on file.  Social History Social History  Substance Use Topics  . Smoking status: Current Every Day Smoker    Packs/day: 0.50    Types: Cigarettes  . Smokeless tobacco: Never Used  . Alcohol use No     Allergies   Patient has no known allergies.   Review of Systems Review of Systems  Constitutional: Positive for appetite change and fatigue. Negative for fever.  Respiratory: Negative for shortness of breath.   Cardiovascular: Negative for  chest pain.  Gastrointestinal: Positive for diarrhea, nausea and vomiting. Negative for abdominal distention, abdominal pain, blood in stool and constipation.  Genitourinary: Negative for difficulty urinating and dysuria.  Neurological: Positive for light-headedness and headaches. Negative for dizziness.  All other systems reviewed and are negative.    Physical Exam Updated Vital Signs BP 122/82 (BP Location: Left Arm)   Pulse (!) 58   Temp 97.8 F (36.6 C) (Oral)   Resp 16   Ht 5\' 2"  (1.575 m)   Wt 65.8 kg   SpO2 100%   BMI 26.52 kg/m   Physical Exam    Constitutional: She is oriented to person, place, and time. She appears well-developed and well-nourished.  HENT:  Head: Normocephalic and atraumatic.  Mucous membranes moist.  Eyes: Conjunctivae are normal.  Neck: Normal range of motion. Neck supple.  Cardiovascular: Regular rhythm, normal heart sounds and intact distal pulses.   No murmur heard. Pulmonary/Chest: Effort normal and breath sounds normal. She has no wheezes. She has no rales.  Abdominal: Soft. Normal appearance and bowel sounds are normal. She exhibits no distension and no mass. There is no tenderness. There is no rebound and no guarding. No hernia.  Neurological: She is alert and oriented to person, place, and time.  Skin: Skin is warm and dry.  Psychiatric: She has a normal mood and affect. Her behavior is normal.  Nursing note and vitals reviewed.    ED Treatments / Results  Labs (all labs ordered are listed, but only abnormal results are displayed) Labs Reviewed  URINALYSIS, ROUTINE W REFLEX MICROSCOPIC - Abnormal; Notable for the following:       Result Value   Color, Urine AMBER (*)    Ketones, ur 15 (*)    Leukocytes, UA SMALL (*)    All other components within normal limits  BASIC METABOLIC PANEL - Abnormal; Notable for the following:    Glucose, Bld 104 (*)    All other components within normal limits  URINALYSIS, MICROSCOPIC (REFLEX) - Abnormal; Notable for the following:    Bacteria, UA MANY (*)    Squamous Epithelial / LPF 6-30 (*)    All other components within normal limits  CBC WITH DIFFERENTIAL/PLATELET    EKG  EKG Interpretation None       Radiology No results found.  Procedures Procedures (including critical care time)  Medications Ordered in ED Medications  sodium chloride 0.9 % bolus 1,000 mL (0 mLs Intravenous Stopped 10/01/16 1305)  ondansetron (ZOFRAN) injection 4 mg (4 mg Intravenous Given 10/01/16 1114)     Initial Impression / Assessment and Plan / ED Course  I have  reviewed the triage vital signs and the nursing notes.  Pertinent labs & imaging results that were available during my care of the patient were reviewed by me and considered in my medical decision making (see chart for details).     Pt presents w constant nausea x5days w vomiting and diarrhea. Initially, pt had vomiting for 2 days and has had diarrhea since yesterday. Denies hematemesis, blood in stool. Abd benign on exam, mucous membranes moist. Pt is not septic, no electrolyte abnormalities. Pt's sx improved w IVF and Zofran. Discharge w Zofran. Encourage fluids and PO intake.   Patient discussed with and seen by Dr. Stark Jock.  Discussed results, findings, treatment and follow up. Patient advised of return precautions. Patient verbalized understanding and agreed with plan.    Final Clinical Impressions(s) / ED Diagnoses   Final diagnoses:  Viral gastroenteritis  New Prescriptions Discharge Medication List as of 10/01/2016  1:19 PM    START taking these medications   Details  ondansetron (ZOFRAN) 4 MG tablet Take 1 tablet (4 mg total) by mouth every 6 (six) hours as needed for nausea or vomiting., Starting Fri 10/01/2016, Print         [place] Nicole Russo, PA-C 10/01/16 [place], MD 10/01/16 1507

## 2016-10-01 NOTE — ED Triage Notes (Signed)
Pt reports constant nausea since Monday. Vomited Monday and Tuesday. Also reports watery diarrhea 1-2 times yesterday and 1 time today. States it is worse after she eats. Denies pain

## 2016-10-01 NOTE — ED Notes (Signed)
ED Provider at bedside. 

## 2016-12-05 DIAGNOSIS — F1210 Cannabis abuse, uncomplicated: Secondary | ICD-10-CM | POA: Insufficient documentation

## 2016-12-05 DIAGNOSIS — F1310 Sedative, hypnotic or anxiolytic abuse, uncomplicated: Secondary | ICD-10-CM | POA: Insufficient documentation

## 2016-12-05 DIAGNOSIS — F1110 Opioid abuse, uncomplicated: Secondary | ICD-10-CM | POA: Insufficient documentation

## 2016-12-22 ENCOUNTER — Encounter (HOSPITAL_BASED_OUTPATIENT_CLINIC_OR_DEPARTMENT_OTHER): Payer: Self-pay | Admitting: *Deleted

## 2016-12-22 ENCOUNTER — Emergency Department (HOSPITAL_BASED_OUTPATIENT_CLINIC_OR_DEPARTMENT_OTHER)
Admission: EM | Admit: 2016-12-22 | Discharge: 2016-12-22 | Disposition: A | Discharge status: Home / Self Care | Payer: Self-pay | Attending: Emergency Medicine | Admitting: Emergency Medicine

## 2016-12-22 ENCOUNTER — Emergency Department (HOSPITAL_BASED_OUTPATIENT_CLINIC_OR_DEPARTMENT_OTHER): Payer: Self-pay

## 2016-12-22 DIAGNOSIS — Z7982 Long term (current) use of aspirin: Secondary | ICD-10-CM | POA: Insufficient documentation

## 2016-12-22 DIAGNOSIS — M25511 Pain in right shoulder: Secondary | ICD-10-CM | POA: Insufficient documentation

## 2016-12-22 DIAGNOSIS — M25519 Pain in unspecified shoulder: Secondary | ICD-10-CM

## 2016-12-22 DIAGNOSIS — Z79899 Other long term (current) drug therapy: Secondary | ICD-10-CM | POA: Insufficient documentation

## 2016-12-22 DIAGNOSIS — F17210 Nicotine dependence, cigarettes, uncomplicated: Secondary | ICD-10-CM | POA: Insufficient documentation

## 2016-12-22 LAB — PREGNANCY, URINE: PREG TEST UR: NEGATIVE

## 2016-12-22 MED ORDER — HYDROCODONE-ACETAMINOPHEN 5-325 MG PO TABS: 2.0000 | ORAL_TABLET | ORAL | 0 refills | Status: DC | PRN

## 2016-12-22 MED FILL — HYDROCODON-APAP 5-325: 5-325 | 2 days supply | Qty: 10 | Fill #0

## 2016-12-22 NOTE — ED Notes (Signed)
Pt unable to give UA at this time 

## 2016-12-22 NOTE — ED Provider Notes (Signed)
[facility] Provider Note   CSN: [id number] Arrival date & time: 12/22/16  1210     History   Chief Complaint Chief Complaint  Patient presents with  . Extremity Pain    HPI Amber Sutton is a 46 y.o. female who presents with ongoing pain to the right side of body that has been ongoing for the last 3-4 months. She states that she has had pain in the right joints including shoulder,elbow, hip, knee. She has not been evaluated by primary care doctor for pain. She denies any preceding trauma or injury.  She denies any preceding events, , insect bites. She states that pain is worse in the morning and worse at night after she works. She reports that most the pain is in her right shoulder. She states that pain is worse with movement or use of her right upper extremity. She has some associated intermittent weakness of the right shoulder. She states that sometimes when she goes to lift heavy things that she has to drop them  because her shoulder feels weak. She has been taking ibuprofen and BC powder with no improvement in symptoms. She denies any preceding trauma or fall or injury. Patient denies any vision changes, chest pain, shortness of breath, nausea/vomiting, difficulty in relating, speech difficulty, warmth or erythema.  The history is provided by the patient.    Past Medical History:  Diagnosis Date  . Depression   . Fever blister   . Ovarian cancer ([place])   . Urinary tract infection     Patient Active Problem List   Diagnosis Date Noted  . Great toe pain, right 07/06/2016  . Finger injury, right, initial encounter 07/06/2016  . ADHD (attention deficit hyperactivity disorder), combined type 02/01/2016  . Major depressive disorder, recurrent episode ([place]) 12/23/2012  . Panic disorder 12/23/2012    Past Surgical History:  Procedure Laterality Date  . ABDOMINAL HYSTERECTOMY    . CESAREAN SECTION      OB History    No data available       Home Medications     Prior to Admission medications   Medication Sig Start Date End Date Taking? Authorizing Provider  buPROPion (WELLBUTRIN XL) 300 MG 24 hr tablet Take 300 mg by mouth every morning. 06/23/16  Yes [provider]  Citalopram Hydrobromide (CELEXA PO) Take by mouth.   Yes [provider]  gabapentin (NEURONTIN) 300 MG capsule TAKE 1 CAPSULE (300 MG TOTAL) BY MOUTH THREE (3) TIMES A DAY. 05/17/16  Yes [provider]  OVER THE COUNTER MEDICATION Goodie Powders   Yes [provider]  ALPRAZolam (XANAX) 1 MG tablet Take 1 mg by mouth 3 (three) times daily as needed. 06/07/16   [provider]  amoxicillin-clavulanate (AUGMENTIN) 875-125 MG tablet Take 1 tablet by mouth every 12 (twelve) hours. 06/24/16   Law, Bea Graff, PA-C  diclofenac (VOLTAREN) 75 MG EC tablet Take 1 tablet (75 mg total) by mouth 2 (two) times daily. 06/30/16   Hudnall, Sharyn Lull, MD  docusate sodium (COLACE) 100 MG capsule Take 1 capsule (100 mg total) by mouth every 12 (twelve) hours. Take to avoid developing constipation and straining at stool. 07/10/16   Charlesetta Shanks, MD  HYDROcodone-acetaminophen (NORCO/VICODIN) 5-325 MG tablet Take 2 tablets by mouth every 4 (four) hours as needed. 12/22/16   Volanda Napoleon, PA-C  ondansetron (ZOFRAN) 4 MG tablet Take 1 tablet (4 mg total) by mouth every 6 (six) hours as needed for nausea or vomiting.  10/01/16   Russo, [place] N, PA-C  sertraline (ZOLOFT) 100 MG tablet TAKE 2 TABLETS BY MOUTH ONCE DIALY 06/16/16   [provider]  zolpidem (AMBIEN) 10 MG tablet TAKE 1 AND 1/2 TABLET EVERY NIGHT 06/23/16   [provider]    Family History No family history on file.  Social History Social History  Substance Use Topics  . Smoking status: Current Every Day Smoker    Packs/day: 0.50    Types: Cigarettes  . Smokeless tobacco: Never Used  . Alcohol use Yes     Comment: rare     Allergies   Patient has no known  allergies.   Review of Systems Review of Systems  Constitutional: Negative for fever.  Respiratory: Negative for cough and shortness of breath.   Cardiovascular: Negative for chest pain.  Gastrointestinal: Negative for abdominal pain, nausea and vomiting.  Genitourinary: Negative for dysuria and hematuria.  Musculoskeletal:       Right shoulder, hip, knee pain  Skin: Negative for color change.  Neurological: Negative for headaches.     Physical Exam Updated Vital Signs BP (!) 122/97 (BP Location: Left Arm)   Pulse 67   Temp 98.5 F (36.9 C) (Oral)   Resp 18   Ht 5\' 3"  (1.6 m)   Wt 65.8 kg (145 lb)   SpO2 100%   BMI 25.69 kg/m   Physical Exam  Constitutional: She appears well-developed and well-nourished.  HENT:  Head: Normocephalic and atraumatic.  Eyes: Conjunctivae and EOM are normal. Right eye exhibits no discharge. Left eye exhibits no discharge. No scleral icterus.  Cardiovascular:  Pulses:      Radial pulses are 2+ on the right side, and 2+ on the left side.       Dorsalis pedis pulses are 2+ on the right side, and 2+ on the left side.  Pulmonary/Chest: Effort normal.  Musculoskeletal:  Full range of motion of left upper extremity. No palpable deformity or crepitus noted. No tenderness palpation of the left upper extremity. To the right shoulder. No overlying deformity or crepitus. No overlying ecchymosis, edema, erythema or warmth. Decreased abduction of right upper extremity. Patient is able to get about 80 before she starts expressing pain. Positive lift off test on right side. Positive Hawkins and impingement on right side. Positive empty can test on right side. Decreased strength of right upper extremity against resistance. No tenderness palpation overlying the right hip. Full flexion/extension of right lower extremity intact without difficulty. No tenderness palpation to right knee. Full flexion of right knee intact without difficulty. No abnormalities of left  lower extremity.  Neurological: She is alert.  Skin: Skin is warm and dry. Capillary refill takes less than 2 seconds.  No overlying warmth or erythema to the right shoulder, right hip, right knee.  Psychiatric: She has a normal mood and affect. Her speech is normal and behavior is normal.  Nursing note and vitals reviewed.    ED Treatments / Results  Labs (all labs ordered are listed, but only abnormal results are displayed) Labs Reviewed  PREGNANCY, URINE    EKG  EKG Interpretation None       Radiology Dg Shoulder Right  Result Date: 12/22/2016 CLINICAL DATA:  Right shoulder pain for few months. No known injury. EXAM: RIGHT SHOULDER - 2+ VIEW COMPARISON:  None. FINDINGS: There is no evidence of fracture or dislocation. There is no evidence of arthropathy or other focal bone abnormality. Soft tissues are unremarkable. IMPRESSION: Negative exam. Electronically Signed  By: Inge Rise M.D.   On: 12/22/2016 14:36    Procedures Procedures (including critical care time)  Medications Ordered in ED Medications - No data to display   Initial Impression / Assessment and Plan / ED Course  I have reviewed the triage vital signs and the nursing notes.  Pertinent labs & imaging results that were available during my care of the patient were reviewed by me and considered in my medical decision making (see chart for details).     On initial entrance into the room, patient is talking on the phone, moving bilateral upper extremities spontaneously.   46 year old female who presents with right side pain. Patient is neurovascularly intact. Pain sounds mostly joint related in her shoulder, hip, knee. She has not been seen by primary care evaluation. On initial entrance into the room, patient is talking in the phone, moving bilateral upper extremities spontaneously. Patient is afebrile, non-toxic appearing, sitting comfortably on examination table. Physical exam concerning for rotator  cuff pathology. Also consider muscular strain versus osteoarthritis. History/physical exam are not concerning for a septic joint. Will obtain x-ray of right shoulder just to evaluate for any fracture or dislocation, the low suspicion given history/physical exam and lack of trauma.   Patient has ambulated in the department several times without any difficulty or pain.  X-ray reviewed negative for any acute fracture dislocation. Discussed results with patient. Explained her that x-rays will show [place] any bony abnormal and will not show muscular or ligamentous injuries. She will likely need outpatient orthopedic follow-up for further evaluation. Will give sling for symptomatically. Instructed patient that she is not to wear the sling 24 7 and is to continue doing range of motion exercises to prevent frozen shoulder. Instructed patient that she should continue taking NSAIDs for pain relief. Patient states that she has a region and like something more for pain. Explained to her that I could give her muscle relaxer which would help with symptoms. The patient states that that "is not enough and will not do anything for her pain." Patient states that she would like something stronger for pain since it has been so long. Patient is ambulating around the department carrying her large purse without difficulty. Suspect there may be a drug-seeking component to her complaints. Patient looked up on [facility] substance database. She last had narcotics in December 2017. We will give her a very short course of pain medications until she is able to follow up with orthopedics. Instructed patient to follow-up with orthopedics in 2 days. Return precautions discussed. Patient expresses understanding and agreement to plan.     Final Clinical Impressions(s) / ED Diagnoses   Final diagnoses:  Shoulder pain  Acute pain of right shoulder    New Prescriptions New Prescriptions   HYDROCODONE-ACETAMINOPHEN (NORCO/VICODIN) 5-325 MG TABLET     Take 2 tablets by mouth every 4 (four) hours as needed.     Volanda Napoleon, PA-C 12/22/16 1642    Orlie Dakin, MD 12/23/16 [phone]

## 2016-12-22 NOTE — ED Triage Notes (Signed)
Pt reports right side of body pain "for months". Right shoulder pain is the worst. Denies known illness or injury. States she has weakness in her right arm at times

## 2016-12-22 NOTE — Discharge Instructions (Signed)
Follow-up with referred orthopedic doctor in the next few days for further evaluation. He may need further imaging.  You can take Tylenol or Ibuprofen as directed for pain.  Return the emergency Department for any worsening pain, swelling/redness of the arm, numbness/weakness of the arm or leg, chest pain, difficult breathing or any other worsening or concerning symptoms.

## 2016-12-29 ENCOUNTER — Ambulatory Visit (INDEPENDENT_AMBULATORY_CARE_PROVIDER_SITE_OTHER): Payer: Self-pay | Admitting: Family Medicine

## 2016-12-29 ENCOUNTER — Encounter: Payer: Self-pay | Admitting: Family Medicine

## 2016-12-29 DIAGNOSIS — M25511 Pain in right shoulder: Secondary | ICD-10-CM

## 2016-12-29 DIAGNOSIS — G8929 Other chronic pain: Secondary | ICD-10-CM

## 2016-12-29 MED ORDER — DICLOFENAC SODIUM 75 MG PO TBEC: 75.0000 mg | DELAYED_RELEASE_TABLET | Freq: Two times a day (BID) | ORAL | 1 refills | Status: DC

## 2016-12-29 NOTE — Patient Instructions (Signed)
You have rotator cuff impingement Try to avoid painful activities (overhead activities, lifting with extended arm) as much as possible. Diclofenac 75mg  twice a day with food for pain and inflammation. Tylenol 500mg  1-2 tabs three times a day - can also take this. Also consider capsaicin topically up to 4 times a day. Subacromial injection may be beneficial to help with pain and to decrease inflammation - strongly consider doing this given how bad your pain is. Consider physical therapy with transition to home exercise program. Do home exercise program with theraband and scapular stabilization exercises daily - these are very important for long term relief even if an injection was given.  3 sets of 10 once a day. If not improving at follow-up we will consider imaging, injection, physical therapy, and/or nitro patches. Follow up with me in 5-6 weeks.

## 2016-12-30 DIAGNOSIS — M25511 Pain in right shoulder: Secondary | ICD-10-CM | POA: Insufficient documentation

## 2016-12-30 NOTE — Assessment & Plan Note (Signed)
2/2 rotator cuff impingement.  Shown home exercises to do daily.  Start diclofenac, tylenol, topical medications.  Declined physical therapy and subacromial injection for now.  F/u in 5-6 weeks otherwise.

## 2016-12-30 NOTE — Progress Notes (Signed)
PCP: Patient, No Pcp Per  Subjective:   HPI: Patient is a 46 y.o. female here for right shoulder pain.  Patient denies known injury or trauma. She reports about 3-4 months ago she started to get pain lateral aspect of right shoulder. Thought she slept on this wrong. Pain has worsened since then and gets to be 10/10 and sharp. Has tried ibuprofen, goody powders, some home exercises. + night pain. No numbness or tingling. No skin changes.  Past Medical History:  Diagnosis Date  . Depression   . Fever blister   . Ovarian cancer ([place])   . Urinary tract infection     Current Outpatient Prescriptions on File Prior to Visit  Medication Sig Dispense Refill  . ALPRAZolam (XANAX) 1 MG tablet Take 1 mg by mouth 3 (three) times daily as needed.  0  . amoxicillin-clavulanate (AUGMENTIN) 875-125 MG tablet Take 1 tablet by mouth every 12 (twelve) hours. 10 tablet 0  . buPROPion (WELLBUTRIN XL) 300 MG 24 hr tablet Take 300 mg by mouth every morning.  2  . Citalopram Hydrobromide (CELEXA PO) Take by mouth.    . docusate sodium (COLACE) 100 MG capsule Take 1 capsule (100 mg total) by mouth every 12 (twelve) hours. Take to avoid developing constipation and straining at stool. 60 capsule 0  . gabapentin (NEURONTIN) 300 MG capsule TAKE 1 CAPSULE (300 MG TOTAL) BY MOUTH THREE (3) TIMES A DAY.  3  . ondansetron (ZOFRAN) 4 MG tablet Take 1 tablet (4 mg total) by mouth every 6 (six) hours as needed for nausea or vomiting. 8 tablet 0  . OVER THE COUNTER MEDICATION Goodie Powders    . sertraline (ZOLOFT) 100 MG tablet TAKE 2 TABLETS BY MOUTH ONCE DIALY  3  . zolpidem (AMBIEN) 10 MG tablet TAKE 1 AND 1/2 TABLET EVERY NIGHT  2   No current facility-administered medications on file prior to visit.     Past Surgical History:  Procedure Laterality Date  . ABDOMINAL HYSTERECTOMY    . CESAREAN SECTION      No Known Allergies  Social History   Social History  . Marital status: Single    Spouse name:  N/A  . Number of children: N/A  . Years of education: N/A   Occupational History  . Not on file.   Social History Main Topics  . Smoking status: Current Every Day Smoker    Packs/day: 0.50    Types: Cigarettes  . Smokeless tobacco: Never Used  . Alcohol use Yes     Comment: rare  . Drug use: Yes    Types: Marijuana     Comment: occasional  . Sexual activity: Not on file   Other Topics Concern  . Not on file   Social History Narrative  . No narrative on file    No family history on file.  BP 116/84   Pulse 87   Ht 5\' 3"  (1.6 m)   Wt 140 lb (63.5 kg)   BMI 24.80 kg/m   Review of Systems: See HPI above.     Objective:  Physical Exam:  Gen: NAD, comfortable in exam room  Right shoulder: No swelling, ecchymoses.  No gross deformity. No TTP. FROM with painful arc. Positive Hawkins, Neers. Negative Yergasons. Strength 5/5 with empty can and resisted internal/external rotation.  Pain empty can. Negative apprehension. NV intact distally.   Left shoulder: FROM without pain.  Assessment & Plan:  1. Right shoulder pain - 2/2 rotator cuff impingement.  Shown home exercises to do daily.  Start diclofenac, tylenol, topical medications.  Declined physical therapy and subacromial injection for now.  F/u in 5-6 weeks otherwise.

## 2017-02-08 ENCOUNTER — Ambulatory Visit: Payer: Self-pay | Admitting: Family Medicine

## 2017-03-25 ENCOUNTER — Emergency Department (HOSPITAL_BASED_OUTPATIENT_CLINIC_OR_DEPARTMENT_OTHER)
Admission: EM | Admit: 2017-03-25 | Discharge: 2017-03-25 | Disposition: A | Discharge status: Home / Self Care | Payer: Self-pay | Attending: Emergency Medicine | Admitting: Emergency Medicine

## 2017-03-25 ENCOUNTER — Emergency Department (HOSPITAL_BASED_OUTPATIENT_CLINIC_OR_DEPARTMENT_OTHER): Payer: Self-pay

## 2017-03-25 ENCOUNTER — Encounter (HOSPITAL_BASED_OUTPATIENT_CLINIC_OR_DEPARTMENT_OTHER): Payer: Self-pay | Admitting: *Deleted

## 2017-03-25 DIAGNOSIS — M25511 Pain in right shoulder: Secondary | ICD-10-CM | POA: Insufficient documentation

## 2017-03-25 DIAGNOSIS — Z8543 Personal history of malignant neoplasm of ovary: Secondary | ICD-10-CM | POA: Insufficient documentation

## 2017-03-25 DIAGNOSIS — G8929 Other chronic pain: Secondary | ICD-10-CM | POA: Insufficient documentation

## 2017-03-25 DIAGNOSIS — F17210 Nicotine dependence, cigarettes, uncomplicated: Secondary | ICD-10-CM | POA: Insufficient documentation

## 2017-03-25 DIAGNOSIS — Z79899 Other long term (current) drug therapy: Secondary | ICD-10-CM | POA: Insufficient documentation

## 2017-03-25 MED ORDER — KETOROLAC TROMETHAMINE 60 MG/2ML IM SOLN
60.0000 mg | Freq: Once | INTRAMUSCULAR | Status: AC
  Administered 2017-03-25: 60 mg via INTRAMUSCULAR
  Filled 2017-03-25: qty 2

## 2017-03-25 NOTE — ED Provider Notes (Signed)
[facility] DEPT [facility] Provider Note   CSN: [id number] Arrival date & time: 03/25/17  1416     History   Chief Complaint Chief Complaint  Patient presents with  . Shoulder Pain    HPI Amber Sutton is a 46 y.o. female.  HPI Patient presents with roughly 9 months of right shoulder pain. States pain is worse with abduction of the right arm. Denies any known injury. Denies numbness or tingling. Says she's been taking ibuprofen at home with little relief. Had seen Dr. Barbaraann Barthel in the past but has not followed up. Past Medical History:  Diagnosis Date  . Depression   . Fever blister   . Ovarian cancer ([place])   . Urinary tract infection     Patient Active Problem List   Diagnosis Date Noted  . Right shoulder pain 12/30/2016  . Opiate abuse, episodic 12/05/2016  . Marijuana abuse 12/05/2016  . Benzodiazepine abuse 12/05/2016  . Great toe pain, right 07/06/2016  . Finger injury, right, initial encounter 07/06/2016  . ADHD (attention deficit hyperactivity disorder), combined type 02/01/2016  . Major depressive disorder, recurrent episode ([place]) 12/23/2012  . Panic disorder 12/23/2012    Past Surgical History:  Procedure Laterality Date  . ABDOMINAL HYSTERECTOMY    . CESAREAN SECTION      OB History    No data available       Home Medications    Prior to Admission medications   Medication Sig Start Date End Date Taking? Authorizing Provider  ALPRAZolam Duanne Moron) 1 MG tablet Take 1 mg by mouth 3 (three) times daily as needed. 06/07/16   [provider]  amoxicillin-clavulanate (AUGMENTIN) 875-125 MG tablet Take 1 tablet by mouth every 12 (twelve) hours. 06/24/16   Law, Bea Graff, PA-C  buPROPion (WELLBUTRIN XL) 300 MG 24 hr tablet Take 300 mg by mouth every morning. 06/23/16   [provider]  Citalopram Hydrobromide (CELEXA PO) Take by mouth.    [provider]  diclofenac (VOLTAREN) 75 MG EC tablet Take 1 tablet (75 mg total) by mouth 2 (two)  times daily. 12/29/16   Hudnall, Sharyn Lull, MD  docusate sodium (COLACE) 100 MG capsule Take 1 capsule (100 mg total) by mouth every 12 (twelve) hours. Take to avoid developing constipation and straining at stool. 07/10/16   Charlesetta Shanks, MD  gabapentin (NEURONTIN) 300 MG capsule TAKE 1 CAPSULE (300 MG TOTAL) BY MOUTH THREE (3) TIMES A DAY. 05/17/16   [provider]  ondansetron (ZOFRAN) 4 MG tablet Take 1 tablet (4 mg total) by mouth every 6 (six) hours as needed for nausea or vomiting. 10/01/16   Russo, [place] N, PA-C  OVER THE COUNTER MEDICATION Goodie Powders    [provider]  sertraline (ZOLOFT) 100 MG tablet TAKE 2 TABLETS BY MOUTH ONCE DIALY 06/16/16   [provider]  zolpidem (AMBIEN) 10 MG tablet TAKE 1 AND 1/2 TABLET EVERY NIGHT 06/23/16   [provider]    Family History No family history on file.  Social History Social History  Substance Use Topics  . Smoking status: Current Every Day Smoker    Packs/day: 0.50    Types: Cigarettes  . Smokeless tobacco: Never Used  . Alcohol use Yes     Comment: rare     Allergies   Patient has no known allergies.   Review of Systems Review of Systems  Constitutional: Negative for chills and fever.  Musculoskeletal: Positive for arthralgias. Negative for back pain, neck pain and neck  stiffness.  Skin: Negative for rash and wound.  Neurological: Negative for weakness and numbness.  All other systems reviewed and are negative.    Physical Exam Updated Vital Signs BP (!) 122/92   Pulse 82   Temp 98.2 F (36.8 C) (Oral)   Resp 16   Ht 5\' 3"  (1.6 m)   Wt 63 kg (139 lb)   SpO2 99%   BMI 24.62 kg/m   Physical Exam  Constitutional: She is oriented to person, place, and time. She appears well-developed and well-nourished. No distress.  HENT:  Head: Normocephalic and atraumatic.  Eyes: Pupils are equal, round, and reactive to light. EOM are normal.  Neck: Normal range of motion. Neck  supple.  No midline cervical tenderness.  Cardiovascular: Normal rate and regular rhythm.   Pulmonary/Chest: Effort normal and breath sounds normal.  Abdominal: Soft. Bowel sounds are normal.  Musculoskeletal: Normal range of motion. She exhibits tenderness. She exhibits no edema or deformity.  Patient with diminished abduction to the right upper extremity due to pain. She has tears to palpation laterally over the deltoid. No obvious deformity or effusions. 2+ distal pulses. Full range of motion of right elbow and right wrist.  Neurological: She is alert and oriented to person, place, and time.  Sensation fully intact. Bilateral grip strength equal. Weakness with right shoulder abduction. Bilateral lower extremity strength intact.  Skin: Skin is warm and dry. Capillary refill takes less than 2 seconds. No rash noted. No erythema.  Psychiatric: She has a normal mood and affect. Her behavior is normal.  Nursing note and vitals reviewed.    ED Treatments / Results  Labs (all labs ordered are listed, but only abnormal results are displayed) Labs Reviewed - No data to display  EKG  EKG Interpretation None       Radiology Dg Shoulder Right  Result Date: 03/25/2017 CLINICAL DATA:  Shoulder pain without known injury EXAM: RIGHT SHOULDER - 2+ VIEW COMPARISON:  12/22/2016 FINDINGS: Old right clavicle deformity. No acute fracture or malalignment. The right lung apex is clear IMPRESSION: No acute osseous abnormality Electronically Signed   By: Donavan Foil M.D.   On: 03/25/2017 16:31    Procedures Procedures (including critical care time)  Medications Ordered in ED Medications  ketorolac (TORADOL) injection 60 mg (60 mg Intramuscular Given 03/25/17 1626)     Initial Impression / Assessment and Plan / ED Course  I have reviewed the triage vital signs and the nursing notes.  Pertinent labs & imaging results that were available during my care of the patient were reviewed by me and  considered in my medical decision making (see chart for details).     X-ray without acute deformity. Advised Tylenol, ibuprofen and heat. Instructed to make follow-up appointment with Dr. Barbaraann Barthel. Return precautions given.  Final Clinical Impressions(s) / ED Diagnoses   Final diagnoses:  Chronic right shoulder pain    New Prescriptions New Prescriptions   No medications on file     Julianne Rice, MD 03/25/17 1644

## 2017-03-25 NOTE — ED Triage Notes (Signed)
Right shoulder pain sudden onset 8 months ago. She has been seen by Dr Barbaraann Barthel for same but refused the injection offered at that time but now she wants the injection.

## 2017-03-25 NOTE — ED Notes (Signed)
Patient is crying in pain as this RN is attempting to d/c her. The patient is upset that " now I am going to be in pain all weekend"  - patient upset that the shot we gave her will only last today. Multiple attempts made to go over paper work, patient continues to cry and states "that shit dont work"  - patient given D/c paper work and she threw it to the side and got up and walked out of the door, states "I aint singing shit"

## 2017-03-26 ENCOUNTER — Encounter (HOSPITAL_BASED_OUTPATIENT_CLINIC_OR_DEPARTMENT_OTHER): Payer: Self-pay | Admitting: Emergency Medicine

## 2017-03-26 ENCOUNTER — Emergency Department (HOSPITAL_BASED_OUTPATIENT_CLINIC_OR_DEPARTMENT_OTHER)
Admission: EM | Admit: 2017-03-26 | Discharge: 2017-03-26 | Disposition: A | Discharge status: Home / Self Care | Payer: Self-pay | Attending: Physician Assistant | Admitting: Physician Assistant

## 2017-03-26 DIAGNOSIS — W260XXA Contact with knife, initial encounter: Secondary | ICD-10-CM | POA: Insufficient documentation

## 2017-03-26 DIAGNOSIS — F17210 Nicotine dependence, cigarettes, uncomplicated: Secondary | ICD-10-CM | POA: Insufficient documentation

## 2017-03-26 DIAGNOSIS — S71111A Laceration without foreign body, right thigh, initial encounter: Secondary | ICD-10-CM | POA: Insufficient documentation

## 2017-03-26 DIAGNOSIS — Z79899 Other long term (current) drug therapy: Secondary | ICD-10-CM | POA: Insufficient documentation

## 2017-03-26 DIAGNOSIS — Z8543 Personal history of malignant neoplasm of ovary: Secondary | ICD-10-CM | POA: Insufficient documentation

## 2017-03-26 DIAGNOSIS — Y999 Unspecified external cause status: Secondary | ICD-10-CM | POA: Insufficient documentation

## 2017-03-26 DIAGNOSIS — Y929 Unspecified place or not applicable: Secondary | ICD-10-CM | POA: Insufficient documentation

## 2017-03-26 DIAGNOSIS — Y939 Activity, unspecified: Secondary | ICD-10-CM | POA: Insufficient documentation

## 2017-03-26 MED ORDER — CEPHALEXIN 500 MG PO CAPS: 500.0000 mg | ORAL_CAPSULE | Freq: Four times a day (QID) | ORAL | 0 refills | Status: DC

## 2017-03-26 MED ORDER — CEPHALEXIN 250 MG PO CAPS
500.0000 mg | ORAL_CAPSULE | Freq: Once | ORAL | Status: AC
  Administered 2017-03-26: 500 mg via ORAL
  Filled 2017-03-26: qty 2

## 2017-03-26 MED ORDER — HYDROCODONE-ACETAMINOPHEN 5-325 MG PO TABS: 1.0000 | ORAL_TABLET | Freq: Four times a day (QID) | ORAL | 0 refills | Status: DC | PRN

## 2017-03-26 MED ORDER — LIDOCAINE-EPINEPHRINE (PF) 2 %-1:200000 IJ SOLN
20.0000 mL | Freq: Once | INTRAMUSCULAR | Status: AC
  Administered 2017-03-26: 20 mL
  Filled 2017-03-26: qty 20

## 2017-03-26 NOTE — ED Provider Notes (Signed)
[facility] DEPT [facility] Provider Note   CSN: [id number] Arrival date & time: 03/26/17  1151     History   Chief Complaint Chief Complaint  Patient presents with  . Extremity Laceration    HPI Amber Sutton is a 46 y.o. female.  Patient with documented polysubstance abuse, visit yesterday for chronic shoulder pain -- presents with a large laceration to her right anterior thigh sustained last night around 10 PM. Patient states that she was washing dishes and dropped a filet knife. As it fell she caught it between her leg in the sink. When she grabbed the knife, she accidentally sliced her anterior thigh. Last tetanus is up-to-date. Patient states that she applied pressure to the wound in the bleeding stopped. However this morning, bleeding resumed and she has not been able to get it to stop. She denies blood thinners.       Past Medical History:  Diagnosis Date  . Depression   . Fever blister   . Ovarian cancer ([place])   . Urinary tract infection     Patient Active Problem List   Diagnosis Date Noted  . Right shoulder pain 12/30/2016  . Opiate abuse, episodic 12/05/2016  . Marijuana abuse 12/05/2016  . Benzodiazepine abuse 12/05/2016  . Great toe pain, right 07/06/2016  . Finger injury, right, initial encounter 07/06/2016  . ADHD (attention deficit hyperactivity disorder), combined type 02/01/2016  . Major depressive disorder, recurrent episode ([place]) 12/23/2012  . Panic disorder 12/23/2012    Past Surgical History:  Procedure Laterality Date  . ABDOMINAL HYSTERECTOMY    . CESAREAN SECTION      OB History    No data available       Home Medications    Prior to Admission medications   Medication Sig Start Date End Date Taking? Authorizing Provider  ALPRAZolam Duanne Moron) 1 MG tablet Take 1 mg by mouth 3 (three) times daily as needed. 06/07/16   [provider]  amoxicillin-clavulanate (AUGMENTIN) 875-125 MG tablet Take 1 tablet by mouth every 12 (twelve)  hours. 06/24/16   Law, Bea Graff, PA-C  buPROPion (WELLBUTRIN XL) 300 MG 24 hr tablet Take 300 mg by mouth every morning. 06/23/16   [provider]  Citalopram Hydrobromide (CELEXA PO) Take by mouth.    [provider]  diclofenac (VOLTAREN) 75 MG EC tablet Take 1 tablet (75 mg total) by mouth 2 (two) times daily. 12/29/16   Hudnall, Sharyn Lull, MD  docusate sodium (COLACE) 100 MG capsule Take 1 capsule (100 mg total) by mouth every 12 (twelve) hours. Take to avoid developing constipation and straining at stool. 07/10/16   Charlesetta Shanks, MD  gabapentin (NEURONTIN) 300 MG capsule TAKE 1 CAPSULE (300 MG TOTAL) BY MOUTH THREE (3) TIMES A DAY. 05/17/16   [provider]  ondansetron (ZOFRAN) 4 MG tablet Take 1 tablet (4 mg total) by mouth every 6 (six) hours as needed for nausea or vomiting. 10/01/16   Russo, [place] N, PA-C  OVER THE COUNTER MEDICATION Goodie Powders    [provider]  sertraline (ZOLOFT) 100 MG tablet TAKE 2 TABLETS BY MOUTH ONCE DIALY 06/16/16   [provider]  zolpidem (AMBIEN) 10 MG tablet TAKE 1 AND 1/2 TABLET EVERY NIGHT 06/23/16   [provider]    Family History No family history on file.  Social History Social History  Substance Use Topics  . Smoking status: Current Every Day Smoker    Packs/day: 0.50    Types: Cigarettes  .  Smokeless tobacco: Never Used  . Alcohol use Yes     Comment: rare     Allergies   Patient has no known allergies.   Review of Systems Review of Systems  Constitutional: Negative for activity change.  Musculoskeletal: Positive for myalgias. Negative for arthralgias, back pain, joint swelling and neck pain.  Skin: Positive for wound.  Neurological: Negative for weakness and numbness.     Physical Exam Updated Vital Signs BP (!) 129/115   Pulse 84   Temp 98.3 F (36.8 C) (Oral)   Resp 18   Ht 5\' 3"  (1.6 m)   Wt 63.5 kg (140 lb)   SpO2 99%   BMI 24.80 kg/m   Physical Exam   Constitutional: She appears well-developed and well-nourished.  HENT:  Head: Normocephalic and atraumatic.  Eyes: Pupils are equal, round, and reactive to light.  Neck: Normal range of motion. Neck supple.  Cardiovascular: Normal pulses.  Exam reveals no decreased pulses.   Musculoskeletal: She exhibits tenderness. She exhibits no edema.       Right hip: Normal.       Right knee: Normal.       Right upper leg: She exhibits laceration.  Neurological: She is alert. No sensory deficit.  Motor, sensation, and vascular distal to the injury is fully intact.   Skin: Skin is warm and dry.  Patient with 19 cm superficial laceration to the right anterior thigh. Approximately 8 cm of which are gaping. There is one area with active venous oozing. Wound is not more than 3 mm in depth at any one part. Wound base appears clean without signs of foreign body. No punctures.  Psychiatric: She has a normal mood and affect.  Nursing note and vitals reviewed.    ED Treatments / Results  Labs (all labs ordered are listed, but only abnormal results are displayed) Labs Reviewed - No data to display  EKG  EKG Interpretation None       Radiology Dg Shoulder Right  Result Date: 03/25/2017 CLINICAL DATA:  Shoulder pain without known injury EXAM: RIGHT SHOULDER - 2+ VIEW COMPARISON:  12/22/2016 FINDINGS: Old right clavicle deformity. No acute fracture or malalignment. The right lung apex is clear IMPRESSION: No acute osseous abnormality Electronically Signed   By: Donavan Foil M.D.   On: 03/25/2017 16:31    Procedures .Marland KitchenLaceration Repair Date/Time: 03/26/2017 2:21 PM Performed by: Carlisle Cater Authorized by: Carlisle Cater   Consent:    Consent obtained:  Verbal   Consent given by:  Patient   Risks discussed:  Infection, pain, need for additional repair and poor wound healing   Alternatives discussed:  Delayed treatment Anesthesia (see MAR for exact dosages):    Anesthesia method:  Local  infiltration   Local anesthetic:  Lidocaine 2% WITH epi Laceration details:    Location:  Leg   Leg location:  R upper leg   Length (cm):  8   Depth (mm):  3 Repair type:    Repair type:  Simple Pre-procedure details:    Preparation:  Patient was prepped and draped in usual sterile fashion Exploration:    Wound exploration: wound explored through full range of motion and entire depth of wound probed and visualized     Wound extent: no fascia violation noted, no foreign bodies/material noted, no muscle damage noted, no tendon damage noted, no underlying fracture noted and no vascular damage noted     Contaminated: no   Treatment:    Area cleansed with:  Saline   Amount of cleaning:  Standard   Irrigation solution:  Sterile saline   Irrigation volume:  500cc   Irrigation method:  Pressure wash   Visualized foreign bodies/material removed: no   Skin repair:    Repair method:  Sutures   Suture size:  5-0   Suture material:  Nylon   Suture technique:  Simple interrupted   Number of sutures:  10 Approximation:    Approximation:  Close   Vermilion border: well-aligned   Post-procedure details:    Patient tolerance of procedure:  Tolerated well, no immediate complications   (including critical care time)  Medications Ordered in ED Medications  lidocaine-EPINEPHrine (XYLOCAINE W/EPI) 2 %-1:200000 (PF) injection 20 mL (20 mLs Infiltration Given by Other 03/26/17 1251)     Initial Impression / Assessment and Plan / ED Course  I have reviewed the triage vital signs and the nursing notes.  Pertinent labs & imaging results that were available during my care of the patient were reviewed by me and considered in my medical decision making (see chart for details).     Patient seen and examined. Work-up initiated. Medications ordered.   Vital signs reviewed and are as follows: BP (!) 129/115   Pulse 84   Temp 98.3 F (36.8 C) (Oral)   Resp 18   Ht 5\' 3"  (1.6 m)   Wt 63.5 kg (140  lb)   SpO2 99%   BMI 24.80 kg/m   Discussed infection risk given 14 hours since accident occurred. Patient has active bleeding and has blood through several dressings. I feel that wound does require closure. Will place patient on Keflex. We discussed signs and symptoms of worsening infection and need to return if these occur. Patient verbalizes understanding and agrees to proceed. Tetanus up-to-date. Wound repaired as above.   Patient counseled on wound care. Patient counseled on need to return or see PCP/urgent care for suture removal in 10 days. Patient was urged to return to the Emergency Department urgently with worsening pain, swelling, expanding erythema especially if it streaks away from the affected area, fever, or if they have any other concerns. Patient verbalized understanding.   Patient counseled on use of narcotic pain medications. Counseled not to combine these medications with others containing tylenol. Urged not to drink alcohol, drive, or perform any other activities that requires focus while taking these medications. The patient verbalizes understanding and agrees with the plan.   Final Clinical Impressions(s) / ED Diagnoses   Final diagnoses:  Laceration of right thigh, initial encounter   Patient with superficial right thigh laceration as above without complication.  New Prescriptions Discharge Medication List as of 03/26/2017  1:26 PM    START taking these medications   Details  cephALEXin (KEFLEX) 500 MG capsule Take 1 capsule (500 mg total) by mouth 4 (four) times daily., Starting Sat 03/26/2017, Print    HYDROcodone-acetaminophen (NORCO/VICODIN) 5-325 MG tablet Take 1 tablet by mouth every 6 (six) hours as needed., Starting Sat 03/26/2017, Print         Carlisle Cater, PA-C 03/26/17 1423    Mackuen, Fredia Sorrow, MD 03/26/17 [phone]

## 2017-03-26 NOTE — Discharge Instructions (Signed)
Please read and follow all provided instructions.  Your diagnoses today include:  1. Laceration of right thigh, initial encounter     Tests performed today include:  Vital signs. See below for your results today.   Medications prescribed:   Keflex (cephalexin) - antibiotic  You have been prescribed an antibiotic medicine: take the entire course of medicine even if you are feeling better. Stopping early can cause the antibiotic not to work.   Vicodin (hydrocodone/acetaminophen) - narcotic pain medication  DO NOT drive or perform any activities that require you to be awake and alert because this medicine can make you drowsy. BE VERY CAREFUL not to take multiple medicines containing Tylenol (also called acetaminophen). Doing so can lead to an overdose which can damage your liver and cause liver failure and possibly death.  Take any prescribed medications only as directed.   Home care instructions:  Follow any educational materials and wound care instructions contained in this packet.   Keep affected area above the level of your heart when possible to minimize swelling. Wash area gently twice a day with warm soapy water. Do not apply alcohol or hydrogen peroxide. Cover the area if it draining or weeping.   Follow-up instructions: Suture Removal: Return to the Emergency Department or see your primary care care doctor in 10 days for a recheck of your wound and removal of your sutures or staples.    Return instructions:  Return to the Emergency Department if you have:  Fever  Worsening pain  Worsening swelling of the wound  Pus draining from the wound  Redness of the skin that moves away from the wound, especially if it streaks away from the affected area   Any other emergent concerns  Your vital signs today were: BP (!) 129/115    Pulse 84    Temp 98.3 F (36.8 C) (Oral)    Resp 18    Ht 5\' 3"  (1.6 m)    Wt 63.5 kg (140 lb)    SpO2 99%    BMI 24.80 kg/m  If your blood  pressure (BP) was elevated above 135/85 this visit, please have this repeated by your doctor within one month. --------------

## 2017-03-26 NOTE — ED Triage Notes (Signed)
Pt with with Laceration to the right anterior thigh last night by filet knife. Bleeding through gauze and tape at triage. Ambulatory.

## 2017-08-15 ENCOUNTER — Emergency Department (HOSPITAL_BASED_OUTPATIENT_CLINIC_OR_DEPARTMENT_OTHER)
Admission: EM | Admit: 2017-08-15 | Discharge: 2017-08-15 | Disposition: A | Discharge status: Home / Self Care | Payer: Self-pay | Attending: Emergency Medicine | Admitting: Emergency Medicine

## 2017-08-15 ENCOUNTER — Encounter (HOSPITAL_BASED_OUTPATIENT_CLINIC_OR_DEPARTMENT_OTHER): Payer: Self-pay | Admitting: *Deleted

## 2017-08-15 ENCOUNTER — Other Ambulatory Visit: Payer: Self-pay

## 2017-08-15 DIAGNOSIS — N390 Urinary tract infection, site not specified: Secondary | ICD-10-CM | POA: Insufficient documentation

## 2017-08-15 DIAGNOSIS — F17210 Nicotine dependence, cigarettes, uncomplicated: Secondary | ICD-10-CM | POA: Insufficient documentation

## 2017-08-15 DIAGNOSIS — Z79899 Other long term (current) drug therapy: Secondary | ICD-10-CM | POA: Insufficient documentation

## 2017-08-15 LAB — URINALYSIS, ROUTINE W REFLEX MICROSCOPIC
Bilirubin Urine: NEGATIVE
Glucose, UA: 100 mg/dL — AB
Hgb urine dipstick: NEGATIVE
Ketones, ur: NEGATIVE mg/dL
LEUKOCYTES UA: NEGATIVE
Nitrite: POSITIVE — AB
PH: 6.5 (ref 5.0–8.0)
Protein, ur: 100 mg/dL — AB
SPECIFIC GRAVITY, URINE: 1.02 (ref 1.005–1.030)

## 2017-08-15 LAB — URINALYSIS, MICROSCOPIC (REFLEX)

## 2017-08-15 MED ORDER — CEPHALEXIN 500 MG PO CAPS: 500.0000 mg | ORAL_CAPSULE | Freq: Four times a day (QID) | ORAL | 0 refills | Status: DC

## 2017-08-15 MED FILL — CEPHALEXIN 500 MG CAPSULE: 500 | 10 days supply | Qty: 40 | Fill #0

## 2017-08-15 NOTE — Discharge Instructions (Signed)
Stay very well hydrated with plenty of water throughout the day. Please take antibiotic until completion.  Follow up with primary care physician in 1 week for recheck of ongoing symptoms.  Please seek immediate care if you develop the following: Your symptoms are no better or worse in 3 days. There is severe back pain or lower abdominal pain.  You have a fever.  There is vomiting.  New symptoms develop You have any additional concerns.

## 2017-08-15 NOTE — ED Provider Notes (Signed)
[facility] EMERGENC[facility] DEPARTMENT Provider Note   CSN: [id number] Arrival date & time: 08/15/17  1327     History   Chief Complaint Chief Complaint  Patient presents with  . Dysuria    HPI Amber Sutton is a 47 y.o. female.  The history is provided by the patient and medical records. No language interpreter was used.  Dysuria   Associated symptoms include frequency. Pertinent negatives include no chills, no nausea, no vomiting and no flank pain.   Amber Sutton is a 47 y.o. female  who presents to the Emergency Department complaining of urinary frequency and pelvic pressure consistent with her previous UTIs.  She denies dysuria or vaginal discharge.  She started having back pain this morning.  No nausea or vomiting.  No fevers.  She has been taking ibuprofen as needed for pain which has been helpful.  Denies aggravating factors.  Past Medical History:  Diagnosis Date  . Depression   . Fever blister   . Ovarian cancer ([place])   . Urinary tract infection     Patient Active Problem List   Diagnosis Date Noted  . Right shoulder pain 12/30/2016  . Opiate abuse, episodic ([place]) 12/05/2016  . Marijuana abuse 12/05/2016  . Benzodiazepine abuse ([place]) 12/05/2016  . Great toe pain, right 07/06/2016  . Finger injury, right, initial encounter 07/06/2016  . ADHD (attention deficit hyperactivity disorder), combined type 02/01/2016  . Major depressive disorder, recurrent episode ([place]) 12/23/2012  . Panic disorder 12/23/2012    Past Surgical History:  Procedure Laterality Date  . ABDOMINAL HYSTERECTOMY    . CESAREAN SECTION      OB History    No data available       Home Medications    Prior to Admission medications   Medication Sig Start Date End Date Taking? Authorizing Provider  ALPRAZolam Duanne Moron) 1 MG tablet Take 1 mg by mouth 3 (three) times daily as needed. 06/07/16   [provider]  amoxicillin-clavulanate (AUGMENTIN) 875-125 MG tablet Take 1 tablet by  mouth every 12 (twelve) hours. 06/24/16   Law, Bea Graff, PA-C  buPROPion (WELLBUTRIN XL) 300 MG 24 hr tablet Take 300 mg by mouth every morning. 06/23/16   [provider]  cephALEXin (KEFLEX) 500 MG capsule Take 1 capsule (500 mg total) by mouth 4 (four) times daily. 08/15/17   Sweet Jarvis, Ozella Almond, PA-C  Citalopram Hydrobromide (CELEXA PO) Take by mouth.    [provider]  diclofenac (VOLTAREN) 75 MG EC tablet Take 1 tablet (75 mg total) by mouth 2 (two) times daily. 12/29/16   Hudnall, Sharyn Lull, MD  docusate sodium (COLACE) 100 MG capsule Take 1 capsule (100 mg total) by mouth every 12 (twelve) hours. Take to avoid developing constipation and straining at stool. 07/10/16   Charlesetta Shanks, MD  gabapentin (NEURONTIN) 300 MG capsule TAKE 1 CAPSULE (300 MG TOTAL) BY MOUTH THREE (3) TIMES A DAY. 05/17/16   [provider]  HYDROcodone-acetaminophen (NORCO/VICODIN) 5-325 MG tablet Take 1 tablet by mouth every 6 (six) hours as needed. 03/26/17   Carlisle Cater, PA-C  ondansetron (ZOFRAN) 4 MG tablet Take 1 tablet (4 mg total) by mouth every 6 (six) hours as needed for nausea or vomiting. 10/01/16   Robinson, [place] N, PA-C  OVER THE COUNTER MEDICATION Goodie Powders    [provider]  sertraline (ZOLOFT) 100 MG tablet TAKE 2 TABLETS BY MOUTH ONCE DIALY 06/16/16   [provider]  zolpidem (AMBIEN) 10 MG tablet TAKE  1 AND 1/2 TABLET EVERY NIGHT 06/23/16   [provider]    Family History No family history on file.  Social History Social History   Tobacco Use  . Smoking status: Current Every Day Smoker    Packs/day: 0.50    Types: Cigarettes  . Smokeless tobacco: Never Used  Substance Use Topics  . Alcohol use: Yes    Comment: rare  . Drug use: Yes    Types: Marijuana    Comment: occasional     Allergies   Patient has no known allergies.   Review of Systems Review of Systems  Constitutional: Negative for chills and fever.    Gastrointestinal: Negative for abdominal pain, diarrhea, nausea and vomiting.  Genitourinary: Positive for frequency and pelvic pain. Negative for difficulty urinating, dysuria, flank pain, vaginal bleeding and vaginal discharge.  Musculoskeletal: Positive for back pain.  Neurological: Negative for headaches.     Physical Exam Updated Vital Signs BP (!) 114/104 (BP Location: Left Arm)   Pulse 72   Temp 98.5 F (36.9 C) (Oral)   Resp 18   Ht 5\' 3"  (1.6 m)   Wt 63.5 kg (140 lb)   SpO2 99%   BMI 24.80 kg/m   Physical Exam  Constitutional: She is oriented to person, place, and time. She appears well-developed and well-nourished. No distress.  Nontoxic-appearing.  HENT:  Head: Normocephalic and atraumatic.  Cardiovascular: Normal rate, regular rhythm and normal heart sounds.  No murmur heard. Pulmonary/Chest: Effort normal and breath sounds normal. No respiratory distress.  Abdominal: Soft. Bowel sounds are normal. She exhibits no distension.  No abdominal tenderness.  Left CVA tenderness.  Musculoskeletal: She exhibits no edema.  Neurological: She is alert and oriented to person, place, and time.  Skin: Skin is warm and dry.  Nursing note and vitals reviewed.    ED Treatments / Results  Labs (all labs ordered are listed, but only abnormal results are displayed) Labs Reviewed  URINALYSIS, ROUTINE W REFLEX MICROSCOPIC - Abnormal; Notable for the following components:      Result Value   Color, Urine ORANGE (*)    Glucose, UA 100 (*)    Protein, ur 100 (*)    Nitrite POSITIVE (*)    All other components within normal limits  URINALYSIS, MICROSCOPIC (REFLEX) - Abnormal; Notable for the following components:   Bacteria, UA RARE (*)    Squamous Epithelial / LPF 6-30 (*)    All other components within normal limits  URINE CULTURE    EKG  EKG Interpretation None       Radiology No results found.  Procedures Procedures (including critical care  time)  Medications Ordered in ED Medications - No data to display   Initial Impression / Assessment and Plan / ED Course  I have reviewed the triage vital signs and the nursing notes.  Pertinent labs & imaging results that were available during my care of the patient were reviewed by me and considered in my medical decision making (see chart for details).    Amber Sutton is a 47 y.o. female who presents to ED for pelvic pressure and urinary frequency consistent with her previous UTIs.  No abdominal tenderness.  Does have mild left CVA tenderness.  UA reviewed and nitrite positive, however 0-5 wbc's and rare bacteria.  Offered pelvic examination for complete exam, but patient declines. Will send urine for cx. Given history/exam and patient with hx of similar with UTI's and nitrite +, will treat with keflex. PCP follow  up encouraged. Return precautions discussed. All questions answered.   Final Clinical Impressions(s) / ED Diagnoses   Final diagnoses:  Lower urinary tract infectious disease    ED Discharge Orders        Ordered    cephALEXin (KEFLEX) 500 MG capsule  4 times daily     08/15/17 1426       Vergene Marland, Ozella Almond, PA-C 08/15/17 1441    Little, Wenda Overland, MD 08/15/17 [phone]

## 2017-08-15 NOTE — ED Triage Notes (Signed)
Dysuria for a week.

## 2017-08-17 LAB — URINE CULTURE: CULTURE: NO GROWTH

## 2017-10-25 ENCOUNTER — Emergency Department (HOSPITAL_BASED_OUTPATIENT_CLINIC_OR_DEPARTMENT_OTHER): Payer: Self-pay

## 2017-10-25 ENCOUNTER — Encounter (HOSPITAL_BASED_OUTPATIENT_CLINIC_OR_DEPARTMENT_OTHER): Payer: Self-pay | Admitting: *Deleted

## 2017-10-25 ENCOUNTER — Emergency Department (HOSPITAL_BASED_OUTPATIENT_CLINIC_OR_DEPARTMENT_OTHER)
Admission: EM | Admit: 2017-10-25 | Discharge: 2017-10-25 | Disposition: A | Discharge status: Home / Self Care | Payer: Self-pay | Attending: Emergency Medicine | Admitting: Emergency Medicine

## 2017-10-25 ENCOUNTER — Other Ambulatory Visit: Payer: Self-pay

## 2017-10-25 DIAGNOSIS — F1210 Cannabis abuse, uncomplicated: Secondary | ICD-10-CM | POA: Insufficient documentation

## 2017-10-25 DIAGNOSIS — Y929 Unspecified place or not applicable: Secondary | ICD-10-CM | POA: Insufficient documentation

## 2017-10-25 DIAGNOSIS — F17210 Nicotine dependence, cigarettes, uncomplicated: Secondary | ICD-10-CM | POA: Insufficient documentation

## 2017-10-25 DIAGNOSIS — Y939 Activity, unspecified: Secondary | ICD-10-CM | POA: Insufficient documentation

## 2017-10-25 DIAGNOSIS — Y998 Other external cause status: Secondary | ICD-10-CM | POA: Insufficient documentation

## 2017-10-25 DIAGNOSIS — Z79899 Other long term (current) drug therapy: Secondary | ICD-10-CM | POA: Insufficient documentation

## 2017-10-25 DIAGNOSIS — X58XXXA Exposure to other specified factors, initial encounter: Secondary | ICD-10-CM | POA: Insufficient documentation

## 2017-10-25 DIAGNOSIS — S63502A Unspecified sprain of left wrist, initial encounter: Secondary | ICD-10-CM | POA: Insufficient documentation

## 2017-10-25 MED ORDER — HYDROCODONE-ACETAMINOPHEN 5-325 MG PO TABS: 1.0000 | ORAL_TABLET | Freq: Four times a day (QID) | ORAL | 0 refills | Status: DC | PRN

## 2017-10-25 MED ORDER — PREDNISONE 20 MG PO TABS: ORAL_TABLET | ORAL | 0 refills | Status: DC

## 2017-10-25 MED FILL — HYDROCODON-APAP 5-325: 5-325 | 2 days supply | Qty: 5 | Fill #0

## 2017-10-25 MED FILL — predniSONE 20 MG TABS: 20 | 4 days supply | Qty: 6 | Fill #0

## 2017-10-25 NOTE — Discharge Instructions (Signed)
Take prednisone as prescribed.   Continue motrin for pain.   Take vicodin for severe pain.   Use splint for comfort.   See ortho for follow up   Return to ER if you have worse wrist pain or swelling, numbness in your fingers

## 2017-10-25 NOTE — ED Provider Notes (Signed)
[facility] EMERGENCY DEPARTMENT Provider Note   CSN: [id number] Arrival date & time: 10/25/17  1235     History   Chief Complaint Chief Complaint  Patient presents with  . Wrist Pain    HPI Amber Sutton is a 47 y.o. female history of UTI presenting with left wrist pain.  Patient states that for the last 2 weeks or so she has been having progressive left wrist pain.  Patient denies any trauma or injury to the wrist.  Patient states that it hurts in the lateral aspect of the left wrist.  States that the pain as sharp and shoots up to her forearm.  She denies repetitive motion injury or heavy lifting.  Denies any fevers or chills.  The history is provided by the patient.    Past Medical History:  Diagnosis Date  . Depression   . Fever blister   . Ovarian cancer ([place])   . Urinary tract infection     Patient Active Problem List   Diagnosis Date Noted  . Right shoulder pain 12/30/2016  . Opiate abuse, episodic ([place]) 12/05/2016  . Marijuana abuse 12/05/2016  . Benzodiazepine abuse ([place]) 12/05/2016  . Great toe pain, right 07/06/2016  . Finger injury, right, initial encounter 07/06/2016  . ADHD (attention deficit hyperactivity disorder), combined type 02/01/2016  . Major depressive disorder, recurrent episode ([place]) 12/23/2012  . Panic disorder 12/23/2012    Past Surgical History:  Procedure Laterality Date  . ABDOMINAL HYSTERECTOMY    . CESAREAN SECTION       OB History   None      Home Medications    Prior to Admission medications   Medication Sig Start Date End Date Taking? Authorizing Provider  ALPRAZolam Duanne Moron) 1 MG tablet Take 1 mg by mouth 3 (three) times daily as needed. 06/07/16   [provider]  amoxicillin-clavulanate (AUGMENTIN) 875-125 MG tablet Take 1 tablet by mouth every 12 (twelve) hours. 06/24/16   Law, Bea Graff, PA-C  buPROPion (WELLBUTRIN XL) 300 MG 24 hr tablet Take 300 mg by mouth every morning. 06/23/16   [provider]  cephALEXin (KEFLEX) 500 MG capsule Take 1 capsule (500 mg total) by mouth 4 (four) times daily. 08/15/17   Ward, Ozella Almond, PA-C  Citalopram Hydrobromide (CELEXA PO) Take by mouth.    [provider]  diclofenac (VOLTAREN) 75 MG EC tablet Take 1 tablet (75 mg total) by mouth 2 (two) times daily. 12/29/16   Hudnall, Sharyn Lull, MD  docusate sodium (COLACE) 100 MG capsule Take 1 capsule (100 mg total) by mouth every 12 (twelve) hours. Take to avoid developing constipation and straining at stool. 07/10/16   Charlesetta Shanks, MD  gabapentin (NEURONTIN) 300 MG capsule TAKE 1 CAPSULE (300 MG TOTAL) BY MOUTH THREE (3) TIMES A DAY. 05/17/16   [provider]  HYDROcodone-acetaminophen (NORCO/VICODIN) 5-325 MG tablet Take 1 tablet by mouth every 6 (six) hours as needed. 03/26/17   Carlisle Cater, PA-C  ondansetron (ZOFRAN) 4 MG tablet Take 1 tablet (4 mg total) by mouth every 6 (six) hours as needed for nausea or vomiting. 10/01/16   Robinson, Martin[place] N, PA-C  OVER THE COUNTER MEDICATION Goodie Powders    [provider]  sertraline (ZOLOFT) 100 MG tablet TAKE 2 TABLETS BY MOUTH ONCE DIALY 06/16/16   [provider]  zolpidem (AMBIEN) 10 MG tablet TAKE 1 AND 1/2 TABLET EVERY NIGHT 06/23/16   [provider]    Family History No family  history on file.  Social History Social History   Tobacco Use  . Smoking status: Current Every Day Smoker    Packs/day: 0.50    Types: Cigarettes  . Smokeless tobacco: Never Used  Substance Use Topics  . Alcohol use: Yes    Comment: rare  . Drug use: Yes    Types: Marijuana    Comment: occasional     Allergies   Patient has no known allergies.   Review of Systems Review of Systems  Musculoskeletal:       L wrist pain   All other systems reviewed and are negative.    Physical Exam Updated Vital Signs BP (!) 115/91   Pulse 77   Temp 98.1 F (36.7 C) (Oral)   Resp 14   Ht 5\' 3"  (1.6 m)   Wt  63.5 kg (140 lb)   SpO2 100%   BMI 24.80 kg/m   Physical Exam  Constitutional: She appears well-developed.  HENT:  Head: Normocephalic.  Eyes: Pupils are equal, round, and reactive to light.  Neck: Normal range of motion.  Cardiovascular: Normal rate.  Pulmonary/Chest: Effort normal.  Abdominal: Soft.  Musculoskeletal:  L wrist slightly dec ROM from pain. No obvious swelling, no evidence of septic joint. More tender on the ulna aspect with no deformity. No tenderness over carpal tunnel   Neurological: She is alert.  Skin: Skin is warm.  Psychiatric: She has a normal mood and affect.  Nursing note and vitals reviewed.    ED Treatments / Results  Labs (all labs ordered are listed, but only abnormal results are displayed) Labs Reviewed - No data to display  EKG None  Radiology Dg Wrist Complete Left  Result Date: 10/25/2017 CLINICAL DATA:  Pain for 2 weeks EXAM: LEFT WRIST - COMPLETE 3+ VIEW COMPARISON:  None. FINDINGS: Frontal, oblique, lateral, and ulnar deviation scaphoid images were obtained. No fracture or dislocation. Joint spaces appear normal. No erosive change. IMPRESSION: No fracture or dislocation.  No evident arthropathy. Electronically Signed   By: Lowella Grip III M.D.   On: 10/25/2017 13:21    Procedures Procedures (including critical care time)  Medications Ordered in ED Medications - No data to display   Initial Impression / Assessment and Plan / ED Course  I have reviewed the triage vital signs and the nursing notes.  Pertinent labs & imaging results that were available during my care of the patient were reviewed by me and considered in my medical decision making (see chart for details).    Amber Sutton is a 47 y.o. female here with L wrist pain. Likely wrist sprain. xrays nl, neurovascular intact. She is on motrin with no relief. Can try some steroids, vicodin prn, wrist splint. Will refer to ortho.    Final Clinical Impressions(s) / ED  Diagnoses   Final diagnoses:  None    ED Discharge Orders    None       Drenda Freeze, MD 10/25/17 1420

## 2017-10-25 NOTE — ED Triage Notes (Signed)
Pain in her left wrist x 2 weeks. No known injury.

## 2017-11-14 ENCOUNTER — Other Ambulatory Visit: Payer: Self-pay

## 2017-11-14 ENCOUNTER — Emergency Department (HOSPITAL_BASED_OUTPATIENT_CLINIC_OR_DEPARTMENT_OTHER)
Admission: EM | Admit: 2017-11-14 | Discharge: 2017-11-14 | Disposition: A | Discharge status: Home / Self Care | Payer: Self-pay | Attending: Emergency Medicine | Admitting: Emergency Medicine

## 2017-11-14 ENCOUNTER — Encounter (HOSPITAL_BASED_OUTPATIENT_CLINIC_OR_DEPARTMENT_OTHER): Payer: Self-pay | Admitting: Emergency Medicine

## 2017-11-14 ENCOUNTER — Emergency Department (HOSPITAL_BASED_OUTPATIENT_CLINIC_OR_DEPARTMENT_OTHER): Payer: Self-pay

## 2017-11-14 DIAGNOSIS — F17210 Nicotine dependence, cigarettes, uncomplicated: Secondary | ICD-10-CM | POA: Insufficient documentation

## 2017-11-14 DIAGNOSIS — Y9301 Activity, walking, marching and hiking: Secondary | ICD-10-CM | POA: Insufficient documentation

## 2017-11-14 DIAGNOSIS — Z8543 Personal history of malignant neoplasm of ovary: Secondary | ICD-10-CM | POA: Insufficient documentation

## 2017-11-14 DIAGNOSIS — Z79899 Other long term (current) drug therapy: Secondary | ICD-10-CM | POA: Insufficient documentation

## 2017-11-14 DIAGNOSIS — Y999 Unspecified external cause status: Secondary | ICD-10-CM | POA: Insufficient documentation

## 2017-11-14 DIAGNOSIS — S51812A Laceration without foreign body of left forearm, initial encounter: Secondary | ICD-10-CM | POA: Insufficient documentation

## 2017-11-14 DIAGNOSIS — W01190A Fall on same level from slipping, tripping and stumbling with subsequent striking against furniture, initial encounter: Secondary | ICD-10-CM | POA: Insufficient documentation

## 2017-11-14 DIAGNOSIS — Y929 Unspecified place or not applicable: Secondary | ICD-10-CM | POA: Insufficient documentation

## 2017-11-14 MED ORDER — LIDOCAINE-EPINEPHRINE (PF) 2 %-1:200000 IJ SOLN
20.0000 mL | Freq: Once | INTRAMUSCULAR | Status: AC
  Administered 2017-11-14: 11:00:00

## 2017-11-14 MED ORDER — IBUPROFEN 400 MG PO TABS
600.0000 mg | ORAL_TABLET | Freq: Once | ORAL | Status: AC
  Administered 2017-11-14: 600 mg via ORAL
  Filled 2017-11-14: qty 1

## 2017-11-14 MED ORDER — LIDOCAINE-EPINEPHRINE (PF) 2 %-1:200000 IJ SOLN
INTRAMUSCULAR | Status: AC
  Filled 2017-11-14: qty 10

## 2017-11-14 NOTE — ED Triage Notes (Signed)
Laceration to left forearm.  Pt fell on piece of glass.  Bleeding slightly but controlled.  Pt states she tripped and fell.  Denies head injury.

## 2017-11-14 NOTE — ED Provider Notes (Signed)
[facility] DEPART[facility] Provider Note   CSN: [id number] Arrival date & time: 11/14/17  1016     History   Chief Complaint Chief Complaint  Patient presents with  . Laceration    HPI Amber Sutton is a 47 y.o. female.  Amber Sutton is a 47 y.o. Female with a history of depression and ovarian cancer, as well as polysubstance abuse, presents to the ED for evaluation of a laceration to the left forearm.  Patient reports she was walking when she tripped and fell and caught herself on a glass table causing laceration to the palmar aspect of the left forearm.  Bleeding is controlled.  Few abrasions surrounding the laceration.  Patient denies any weakness, numbness or tingling in the arm is able to move her hand without difficulty, just complaining of pain surrounding the laceration she denies any pain at the distal wrist.  Patient reports her tetanus is up-to-date.  No other injuries from the fall.  Did not hit her head.  No medications prior to arrival to treat pain, no other alleviating or aggravating factors.     Past Medical History:  Diagnosis Date  . Depression   . Fever blister   . Ovarian cancer ([place])   . Urinary tract infection     Patient Active Problem List   Diagnosis Date Noted  . Right shoulder pain 12/30/2016  . Opiate abuse, episodic ([place]) 12/05/2016  . Marijuana abuse 12/05/2016  . Benzodiazepine abuse ([place]) 12/05/2016  . Great toe pain, right 07/06/2016  . Finger injury, right, initial encounter 07/06/2016  . ADHD (attention deficit hyperactivity disorder), combined type 02/01/2016  . Major depressive disorder, recurrent episode ([place]) 12/23/2012  . Panic disorder 12/23/2012    Past Surgical History:  Procedure Laterality Date  . ABDOMINAL HYSTERECTOMY    . CESAREAN SECTION       OB History   None      Home Medications    Prior to Admission medications   Medication Sig Start Date End Date Taking? Authorizing Provider  ALPRAZolam  Duanne Moron) 1 MG tablet Take 1 mg by mouth 3 (three) times daily as needed. 06/07/16   [provider]  amoxicillin-clavulanate (AUGMENTIN) 875-125 MG tablet Take 1 tablet by mouth every 12 (twelve) hours. 06/24/16   Law, Bea Graff, PA-C  buPROPion (WELLBUTRIN XL) 300 MG 24 hr tablet Take 300 mg by mouth every morning. 06/23/16   [provider]  cephALEXin (KEFLEX) 500 MG capsule Take 1 capsule (500 mg total) by mouth 4 (four) times daily. 08/15/17   Ward, Ozella Almond, PA-C  Citalopram Hydrobromide (CELEXA PO) Take by mouth.    [provider]  diclofenac (VOLTAREN) 75 MG EC tablet Take 1 tablet (75 mg total) by mouth 2 (two) times daily. 12/29/16   Hudnall, Sharyn Lull, MD  docusate sodium (COLACE) 100 MG capsule Take 1 capsule (100 mg total) by mouth every 12 (twelve) hours. Take to avoid developing constipation and straining at stool. 07/10/16   Charlesetta Shanks, MD  gabapentin (NEURONTIN) 300 MG capsule TAKE 1 CAPSULE (300 MG TOTAL) BY MOUTH THREE (3) TIMES A DAY. 05/17/16   [provider]  HYDROcodone-acetaminophen (NORCO/VICODIN) 5-325 MG tablet Take 1 tablet by mouth every 6 (six) hours as needed. 10/25/17   Drenda Freeze, MD  ondansetron (ZOFRAN) 4 MG tablet Take 1 tablet (4 mg total) by mouth every 6 (six) hours as needed for nausea or vomiting. 10/01/16   Robinson, [place] N, PA-C  OVER THE  COUNTER MEDICATION Goodie Powders    [provider]  predniSONE (DELTASONE) 20 MG tablet Take 40 mg daily x 2 days then 20 mg daily x 2 days 10/25/17   Drenda Freeze, MD  sertraline (ZOLOFT) 100 MG tablet TAKE 2 TABLETS BY MOUTH ONCE DIALY 06/16/16   [provider]  zolpidem (AMBIEN) 10 MG tablet TAKE 1 AND 1/2 TABLET EVERY NIGHT 06/23/16   [provider]    Family History No family history on file.  Social History Social History   Tobacco Use  . Smoking status: Current Every Day Smoker    Packs/day: 0.50    Types: Cigarettes  .  Smokeless tobacco: Never Used  Substance Use Topics  . Alcohol use: Yes    Comment: rare  . Drug use: Yes    Types: Marijuana    Comment: occasional     Allergies   Patient has no known allergies.   Review of Systems Review of Systems  Constitutional: Negative for chills and fever.  Musculoskeletal: Negative for arthralgias and joint swelling.  Skin: Positive for wound. Negative for color change and rash.  Neurological: Negative for weakness and numbness.     Physical Exam Updated Vital Signs BP 114/90 (BP Location: Right Arm)   Pulse (!) 53   Temp 98.3 F (36.8 C) (Oral)   Resp 20   SpO2 100%   Physical Exam  Constitutional: She appears well-developed and well-nourished. No distress.  HENT:  Head: Normocephalic and atraumatic.  Eyes: Right eye exhibits no discharge. Left eye exhibits no discharge.  Pulmonary/Chest: Effort normal. No respiratory distress.  Musculoskeletal:  7 cm linear laceration of the mid left forearm, bleeding is controlled, no evidence of tendon injury mild tenderness surrounding the laceration with no obvious bony deformity, no tenderness at the distal wrist, no snuffbox tenderness, patient able to make a fist and move all fingers without difficulty, median, ulnar and radial nerve appear to be intact, 2+ radial pulse with good capillary refill, 5/5 grip strength Few superficial scratches on the dorsal and palmar surface of the forearm  Neurological: She is alert. Coordination normal.  Skin: Skin is warm and dry. Capillary refill takes less than 2 seconds. She is not diaphoretic.  Psychiatric: She has a normal mood and affect. Her behavior is normal.  Nursing note and vitals reviewed.      ED Treatments / Results  Labs (all labs ordered are listed, but only abnormal results are displayed) Labs Reviewed - No data to display  EKG None  Radiology No results found.  Procedures .Marland KitchenLaceration Repair Date/Time: 11/14/2017 11:57 AM Performed  by: Jacqlyn Larsen, PA-C Authorized by: Jacqlyn Larsen, PA-C   Consent:    Consent obtained:  Verbal   Consent given by:  Patient   Risks discussed:  Infection, pain, poor cosmetic result and poor wound healing   Alternatives discussed:  No treatment Anesthesia (see MAR for exact dosages):    Anesthesia method:  Local infiltration   Local anesthetic:  Lidocaine 2% WITH epi Laceration details:    Location:  Shoulder/arm   Shoulder/arm location:  L lower arm   Length (cm):  7   Depth (mm):  3 Repair type:    Repair type:  Simple Pre-procedure details:    Preparation:  Patient was prepped and draped in usual sterile fashion and imaging obtained to evaluate for foreign bodies Exploration:    Hemostasis achieved with:  LET and direct pressure   Wound exploration: wound explored  through full range of motion and entire depth of wound probed and visualized     Wound extent: areolar tissue violated     Wound extent: no nerve damage noted, no tendon damage noted and no underlying fracture noted   Treatment:    Area cleansed with:  Saline and Shur-Clens   Amount of cleaning:  Standard   Irrigation solution:  Sterile saline   Irrigation volume:  500 mL   Irrigation method:  Pressure wash Skin repair:    Repair method:  Sutures   Suture size:  4-0   Suture material:  Prolene   Number of sutures:  11 Approximation:    Approximation:  Close Post-procedure details:    Dressing:  Antibiotic ointment and adhesive bandage   Patient tolerance of procedure:  Tolerated well, no immediate complications   (including critical care time)  Medications Ordered in ED Medications  lidocaine-EPINEPHrine (XYLOCAINE W/EPI) 2 %-1:200000 (PF) injection 20 mL (has no administration in time range)  lidocaine-EPINEPHrine (XYLOCAINE W/EPI) 2 %-1:200000 (PF) injection (has no administration in time range)     Initial Impression / Assessment and Plan / ED Course  I have reviewed the triage vital signs and  the nursing notes.  Pertinent labs & imaging results that were available during my care of the patient were reviewed by me and considered in my medical decision making (see chart for details).  Pressure irrigation performed. Wound explored and base of wound visualized in a bloodless field without evidence of foreign body.  Laceration occurred just prior to arrival, imaging negative for underlying fracture or radiopaque foreign body.  Tetanus is up-to-date.  Laceration repaired with simple interrupted sutures with good cosmesis, patient tolerated well with no acute complications.  Pt has no comorbidities to effect normal wound healing. Pt discharged without antibiotics.  Discussed suture home care with patient and answered questions. Pt to follow-up for wound check and suture removal in 7 days; they are to return to the ED sooner for signs of infection. Pt is hemodynamically stable with no complaints prior to dc.    Final Clinical Impressions(s) / ED Diagnoses   Final diagnoses:  Laceration of left forearm, initial encounter    ED Discharge Orders    None       Janet Berlin 11/14/17 1204    Jola Schmidt, MD 11/14/17 [phone]

## 2017-11-14 NOTE — Discharge Instructions (Signed)
Keep area clean and dry, use antibiotic ointment and dressing to keep area covered.  You may shower but do not submerge the laceration under water.  You will need to have your sutures removed in 7 to 10 days.  Ibuprofen and Tylenol as needed for pain.  Return to the ED for signs of infection including redness, swelling, drainage, fevers or chills or any other new or concerning symptoms.

## 2018-05-04 ENCOUNTER — Emergency Department (HOSPITAL_BASED_OUTPATIENT_CLINIC_OR_DEPARTMENT_OTHER): Payer: Self-pay

## 2018-05-04 ENCOUNTER — Encounter (HOSPITAL_BASED_OUTPATIENT_CLINIC_OR_DEPARTMENT_OTHER): Payer: Self-pay | Admitting: *Deleted

## 2018-05-04 ENCOUNTER — Other Ambulatory Visit: Payer: Self-pay

## 2018-05-04 ENCOUNTER — Emergency Department (HOSPITAL_BASED_OUTPATIENT_CLINIC_OR_DEPARTMENT_OTHER)
Admission: EM | Admit: 2018-05-04 | Discharge: 2018-05-04 | Disposition: A | Discharge status: Home / Self Care | Payer: Self-pay | Attending: Emergency Medicine | Admitting: Emergency Medicine

## 2018-05-04 DIAGNOSIS — H1032 Unspecified acute conjunctivitis, left eye: Secondary | ICD-10-CM | POA: Insufficient documentation

## 2018-05-04 DIAGNOSIS — M25531 Pain in right wrist: Secondary | ICD-10-CM | POA: Insufficient documentation

## 2018-05-04 DIAGNOSIS — F17210 Nicotine dependence, cigarettes, uncomplicated: Secondary | ICD-10-CM | POA: Insufficient documentation

## 2018-05-04 MED ORDER — PREDNISONE 20 MG PO TABS: 40.0000 mg | ORAL_TABLET | Freq: Every day | ORAL | 0 refills | Status: AC

## 2018-05-04 MED ORDER — POLYMYXIN B-TRIMETHOPRIM 10000-0.1 UNIT/ML-% OP SOLN: 1.0000 [drp] | OPHTHALMIC | 0 refills | Status: AC

## 2018-05-04 NOTE — Discharge Instructions (Signed)
Please place drops in your left eye for pinkeye.  As we discussed, throw away your eye make-up and wash pillows and bedding thoroughly.  Your symptoms and your right hand are likely carpal tunnel.  Continue taking ibuprofen and Tylenol as needed.  I have written you a prescription for steroid medicine for the next couple of days.  Continue wearing the brace at home.  I have given you the information to follow-up with a hand doctor if your symptoms are not improving.

## 2018-05-04 NOTE — ED Triage Notes (Signed)
Pain in her right wrist x 2 weeks. No known injury. She has redness to her left eye.

## 2018-05-04 NOTE — ED Provider Notes (Signed)
[facility] EMERGENCY DEPARTMENT Provider Note   CSN: [id number] Arrival date & time: 05/04/18  1312     History   Chief Complaint No chief complaint on file.   HPI Amber Sutton is a 47 y.o. female.  HPI  Amber Sutton is a 47yo female with a history of ADHD, depression who presents to the emergency department for evaluation of multiple complaints.  Patient reports that she has had intermittent right wrist pain which radiates to her fourth and fifth fingers over the past 3 weeks now.  No inciting injury.  She reports pain is sharp and shooting.  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  Reports pain is 7/10 in severity currently.  She has tried ibuprofen and ice without relief.  She did try wearing a wrist brace which did temporarily help with her symptoms.  She is right-handed.  She occasionally has tingling in the right fourth and fifth fingers.  Denies fevers, chills, joint swelling or erythema.  Patient also reports that over the last day she has had redness and drainage in her left eye.  States it feels similar to pinkeye in the past.  No visual disturbance, eye pain, lid swelling or photophobia.  No sick contacts with similar symptoms.  Past Medical History:  Diagnosis Date  . Depression   . Fever blister   . Ovarian cancer ([place])   . Urinary tract infection     Patient Active Problem List   Diagnosis Date Noted  . Right shoulder pain 12/30/2016  . Opiate abuse, episodic ([place]) 12/05/2016  . Marijuana abuse 12/05/2016  . Benzodiazepine abuse ([place]) 12/05/2016  . Great toe pain, right 07/06/2016  . Finger injury, right, initial encounter 07/06/2016  . ADHD (attention deficit hyperactivity disorder), combined type 02/01/2016  . Major depressive disorder, recurrent episode ([place]) 12/23/2012  . Panic disorder 12/23/2012    Past Surgical History:  Procedure Laterality Date  . ABDOMINAL HYSTERECTOMY    .  CESAREAN SECTION       OB History   None      Home Medications    Prior to Admission medications   Not on File    Family History No family history on file.  Social History Social History   Tobacco Use  . Smoking status: Current Every Day Smoker    Packs/day: 0.50    Types: Cigarettes  . Smokeless tobacco: Never Used  Substance Use Topics  . Alcohol use: Yes    Comment: rare  . Drug use: Yes    Types: Marijuana    Comment: occasional     Allergies   Patient has no known allergies.   Review of Systems Review of Systems  Constitutional: Negative for chills and fever.  Eyes: Positive for discharge and redness. Negative for photophobia, pain, itching and visual disturbance.  Musculoskeletal: Positive for arthralgias (right wrist and fourth and fifth fingers). Negative for joint swelling.  Skin: Negative for color change.  Neurological: Positive for numbness. Negative for weakness.    Physical Exam Updated Vital Signs BP (!) 145/108   Pulse 78   Temp 97.8 F (36.6 C) (Oral)   Resp 16   Ht 5\' 3"  (1.6 m)   Wt 59 kg   SpO2 100%   BMI 23.03 kg/m   Physical Exam  Constitutional: She is oriented to person, place, and time. She appears well-developed and well-nourished. No distress.  HENT:  Head: Normocephalic  and atraumatic.  Eyes: Pupils are equal, round, and reactive to light. Right eye exhibits no discharge. Left eye exhibits no discharge.  Left conjunctiva injected.   Pulmonary/Chest: Effort normal. No respiratory distress.  Musculoskeletal:  Right wrist non-tender to palpation. Right hand and fingers non-tender to palpation. No swelling, ecchymosis, erythema over the wrist or hand. Full wrist ROM, although moderately tender with right wrist extension. Grip strength 4/5 right vs 5/5 left. Radial pulses 2+ bilaterally. Sensation to light touch intact in radian, median and ulnar nerve distribution of right hand.  Positive Phalen right.   Neurological: She  is alert and oriented to person, place, and time. Coordination normal.  Skin: Skin is warm and dry. Capillary refill takes less than 2 seconds. She is not diaphoretic.  Psychiatric: She has a normal mood and affect. Her behavior is normal.  Nursing note and vitals reviewed.    ED Treatments / Results  Labs (all labs ordered are listed, but only abnormal results are displayed) Labs Reviewed - No data to display  EKG None  Radiology Dg Wrist Complete Right  Result Date: 05/04/2018 CLINICAL DATA:  Right wrist pain for 2 weeks.  No known injury. EXAM: RIGHT WRIST - COMPLETE 3+ VIEW COMPARISON:  None. FINDINGS: There is no evidence of fracture or dislocation. There is no evidence of arthropathy or other focal bone abnormality. Soft tissues are unremarkable. IMPRESSION: Negative. Electronically Signed   By: Dorise Bullion III M.D   On: 05/04/2018 14:25    Procedures Procedures (including critical care time)  Medications Ordered in ED Medications - No data to display   Initial Impression / Assessment and Plan / ED Course  I have reviewed the triage vital signs and the nursing notes.  Pertinent labs & imaging results that were available during my care of the patient were reviewed by me and considered in my medical decision making (see chart for details).     Presents with multiple complaints  Right wrist pain No trauma. Pain shoots to digits 4 and 5. On exam, hand neurovascularly intact. No erythema, warmth or swelling to suggest infection. Positive Phalen sign. Xray from triage of right wrist negative for fracture. Symptoms consistent with carpal tunnel. Plan to treat with steroid burst, NSAIDs and brace. Will give her information to follow up with hand if symptoms do not improve with symptomatic management.   Left eye redness/drainage She denies visual disturbance or pain. Conjunctiva injected on exam with drainage. Symptoms consistent with conjunctivitis.  Presentation  non-concerning for iritis, corneal abrasion, orbital or preseptal cellulitis, HSV. Will discharge with polytrim. Personal hygiene and frequent handwashing discussed.  Patient advised to followup with ophthalmologist if symptoms persist or worsen in any way. Patient verbalizes understanding and is agreeable with discharge.  Final Clinical Impressions(s) / ED Diagnoses   Final diagnoses:  Right wrist pain  Acute conjunctivitis of left eye, unspecified acute conjunctivitis type    ED Discharge Orders         Ordered    trimethoprim-polymyxin b (POLYTRIM) ophthalmic solution  Every 4 hours     05/04/18 1751    predniSONE (DELTASONE) 20 MG tablet  Daily     05/04/18 1751           Glyn Ade, PA-C 05/05/18 1607    Malvin Johns, MD 05/05/18 2027

## 2018-10-03 IMAGING — CR DG WRIST COMPLETE 3+V*L*
4 series · 4 of 4 positions shown · non-contrast
Comparison: None.

CLINICAL DATA: Pain for 2 weeks

EXAM:
LEFT WRIST - COMPLETE 3+ VIEW

[x wrist pa left]
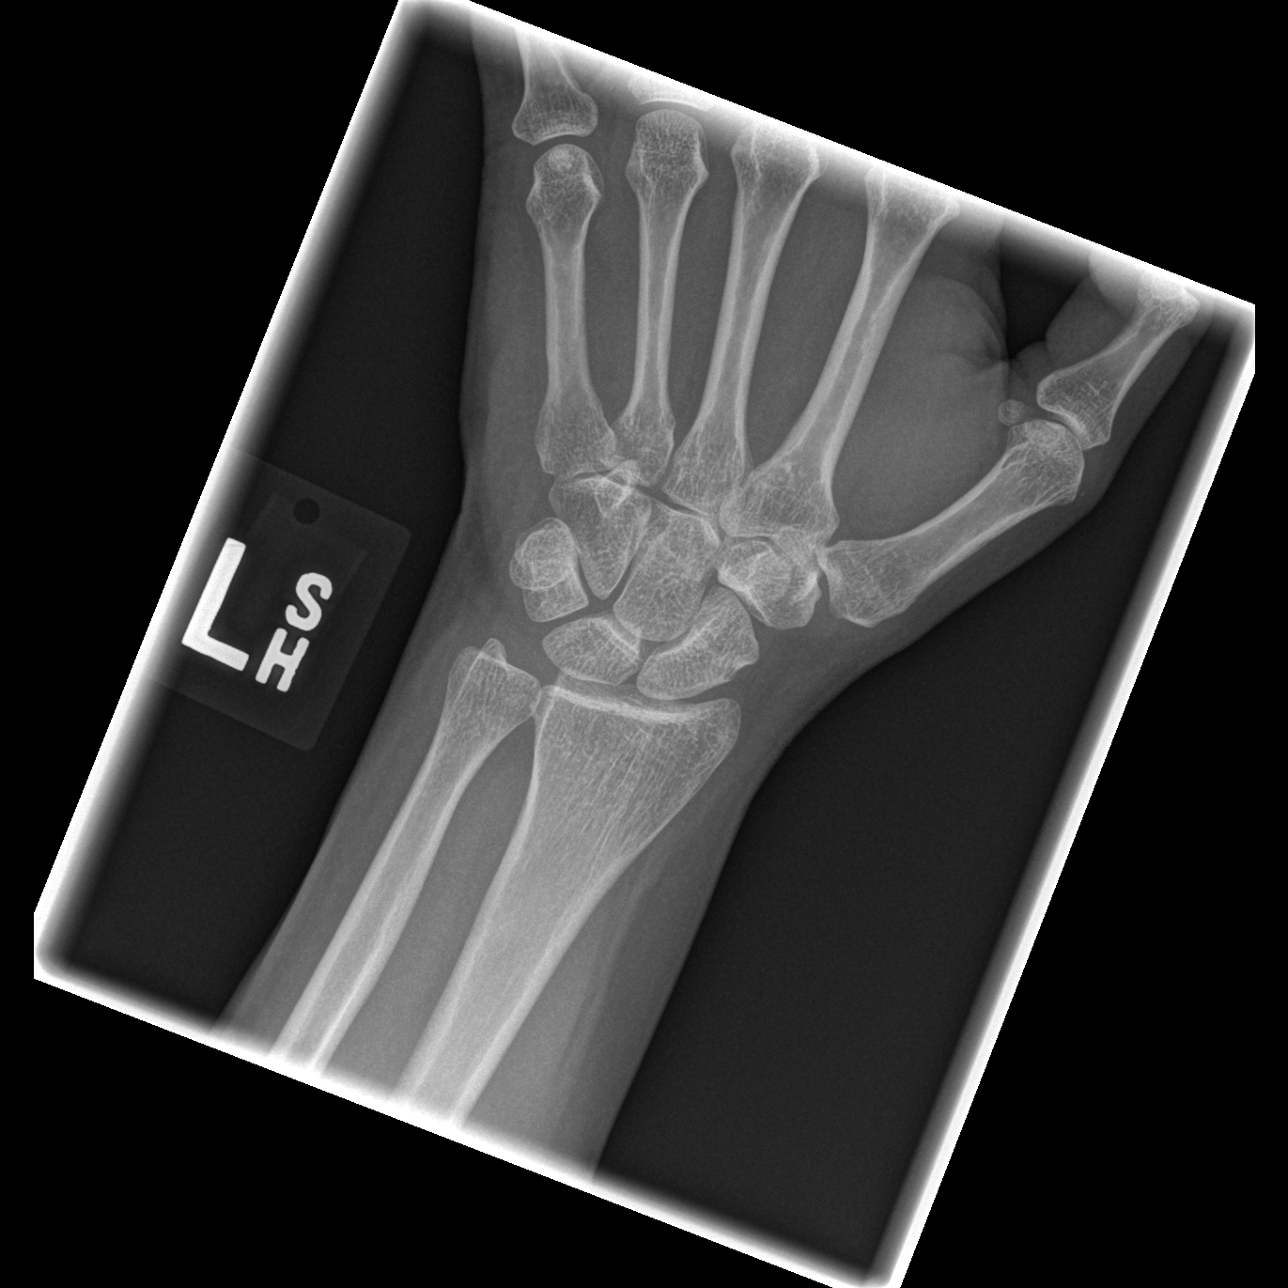

[x wrist obl left]
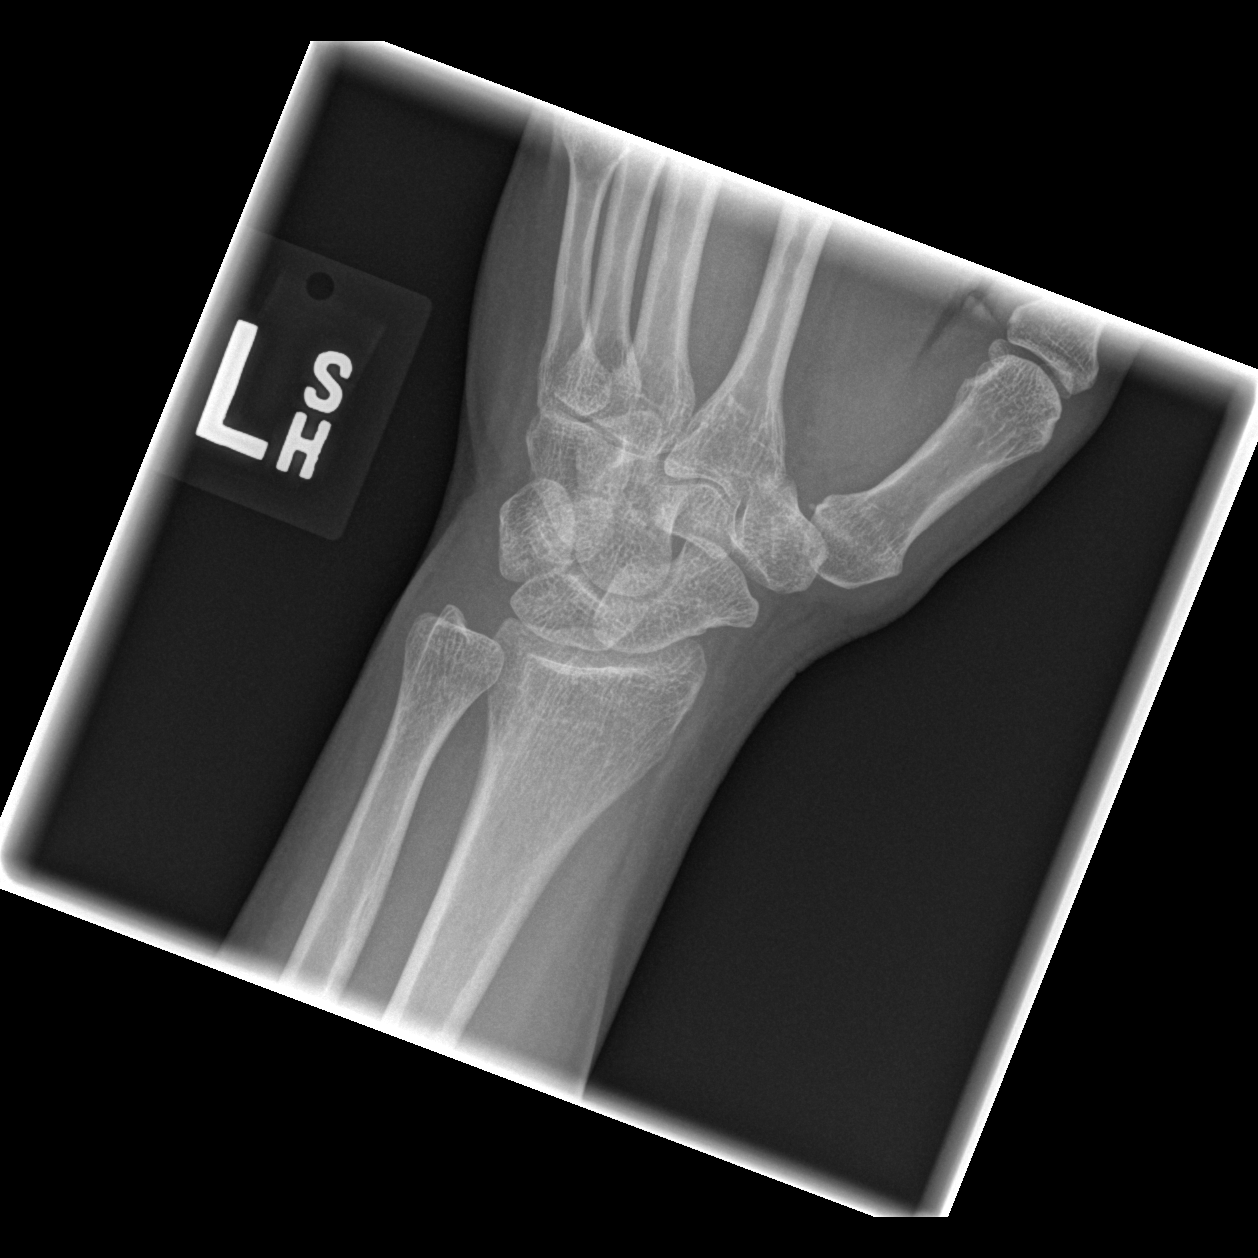

[x wrist lat left]
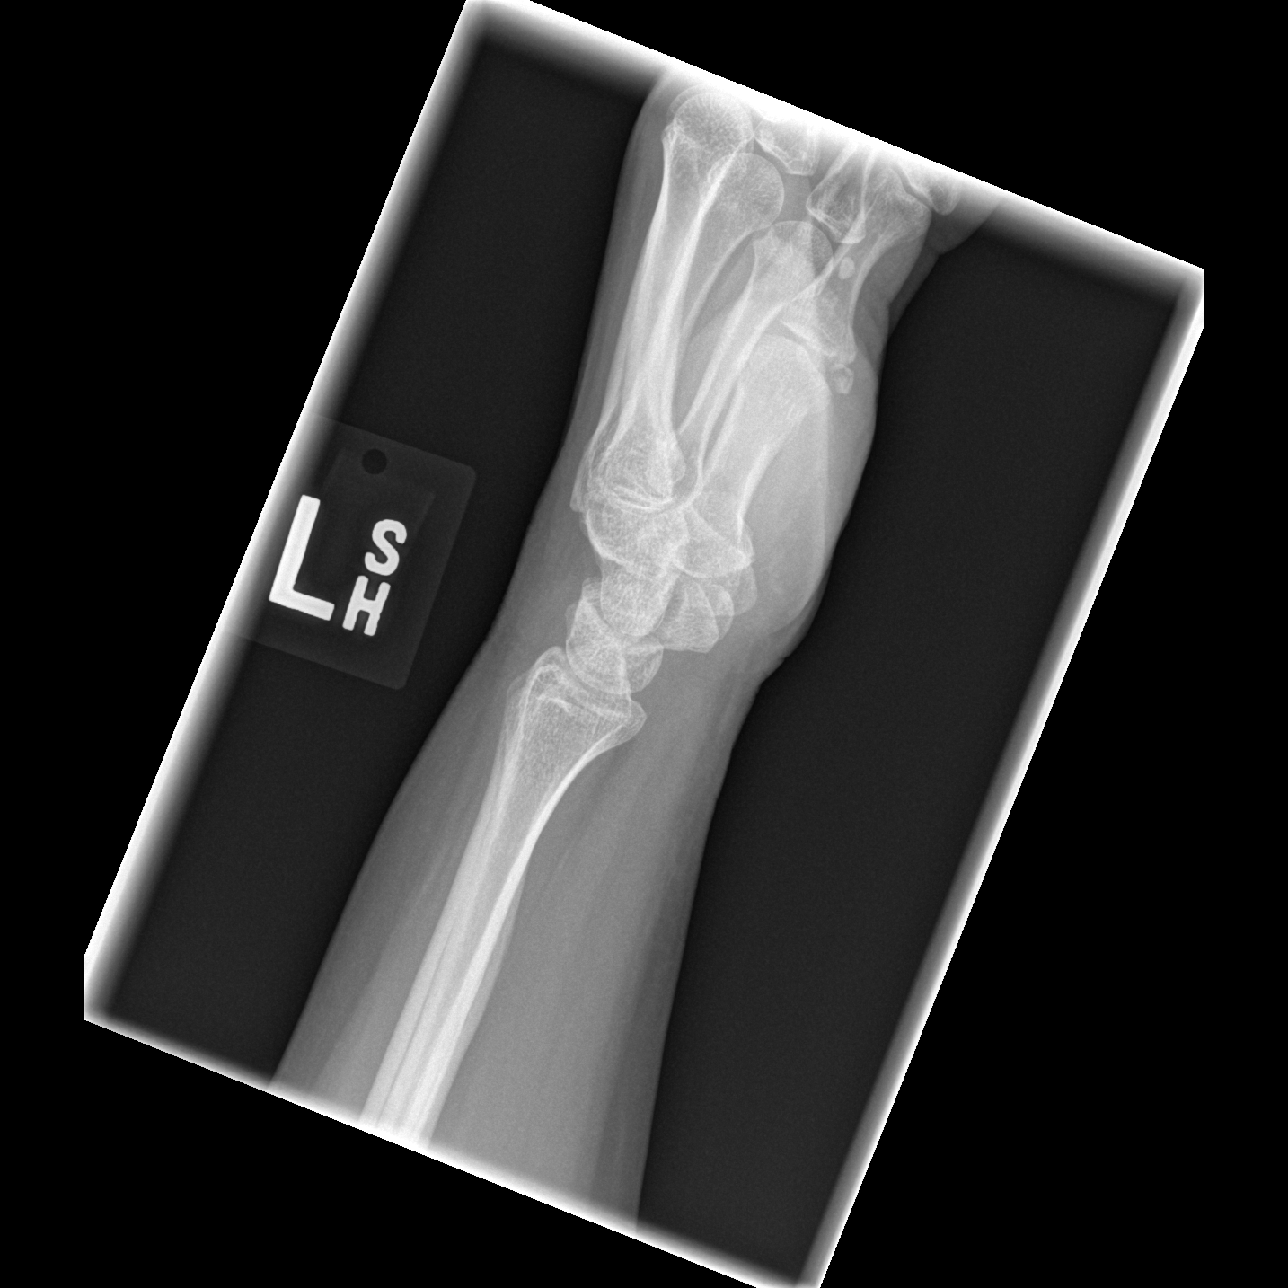

[x navicular]
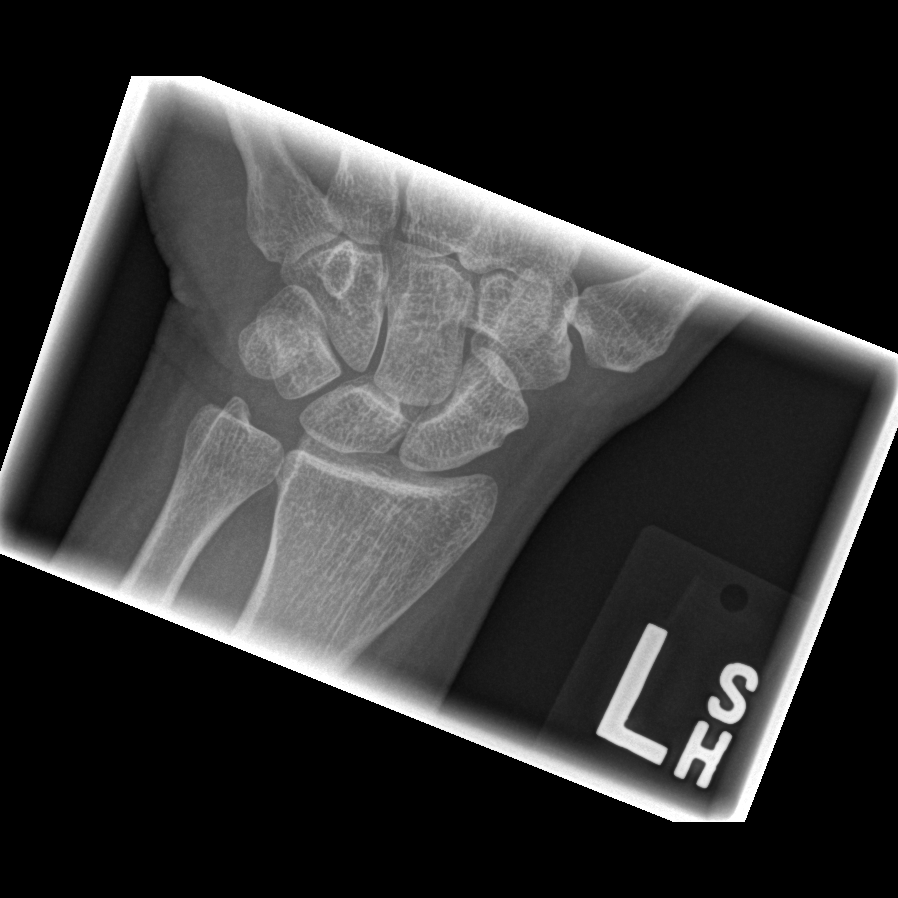

[4 of 4 positions shown; findings below may reference images not displayed]

FINDINGS: Frontal, oblique, lateral, and ulnar deviation scaphoid images were
obtained. No fracture or dislocation. Joint spaces appear normal. No
erosive change.
IMPRESSION: No fracture or dislocation.  No evident arthropathy.

## 2018-10-23 IMAGING — CR DG FOREARM 2V*L*
2 series · 2 of 2 positions shown · non-contrast
Comparison: None.

CLINICAL DATA: Patient states that she tripped and fell onto a
piece of shattered glass last night, has laceration to distal
anterior left forearm, no other complaints

EXAM:
LEFT FOREARM - 2 VIEW

[x forearm ap left]
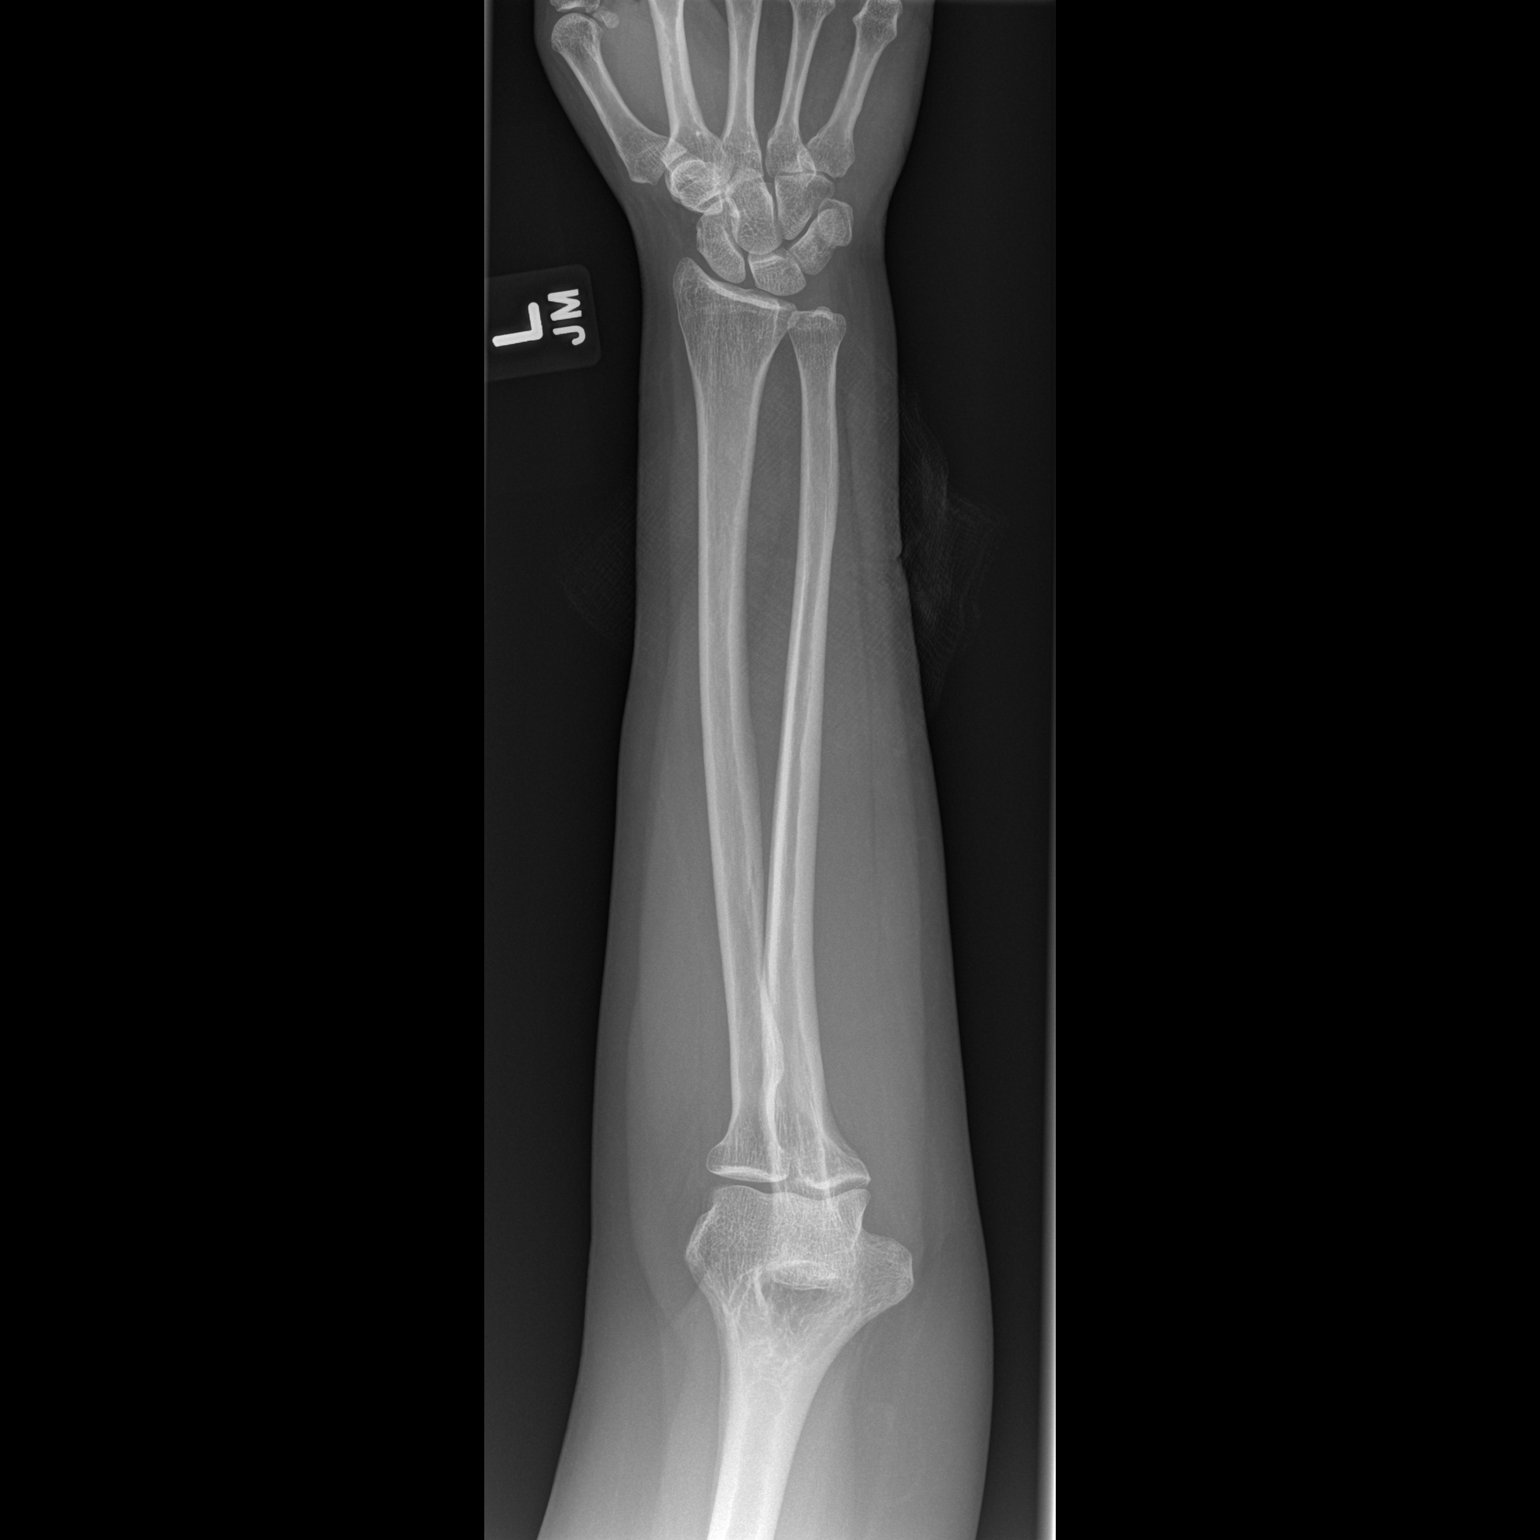

[x forearm lat left]
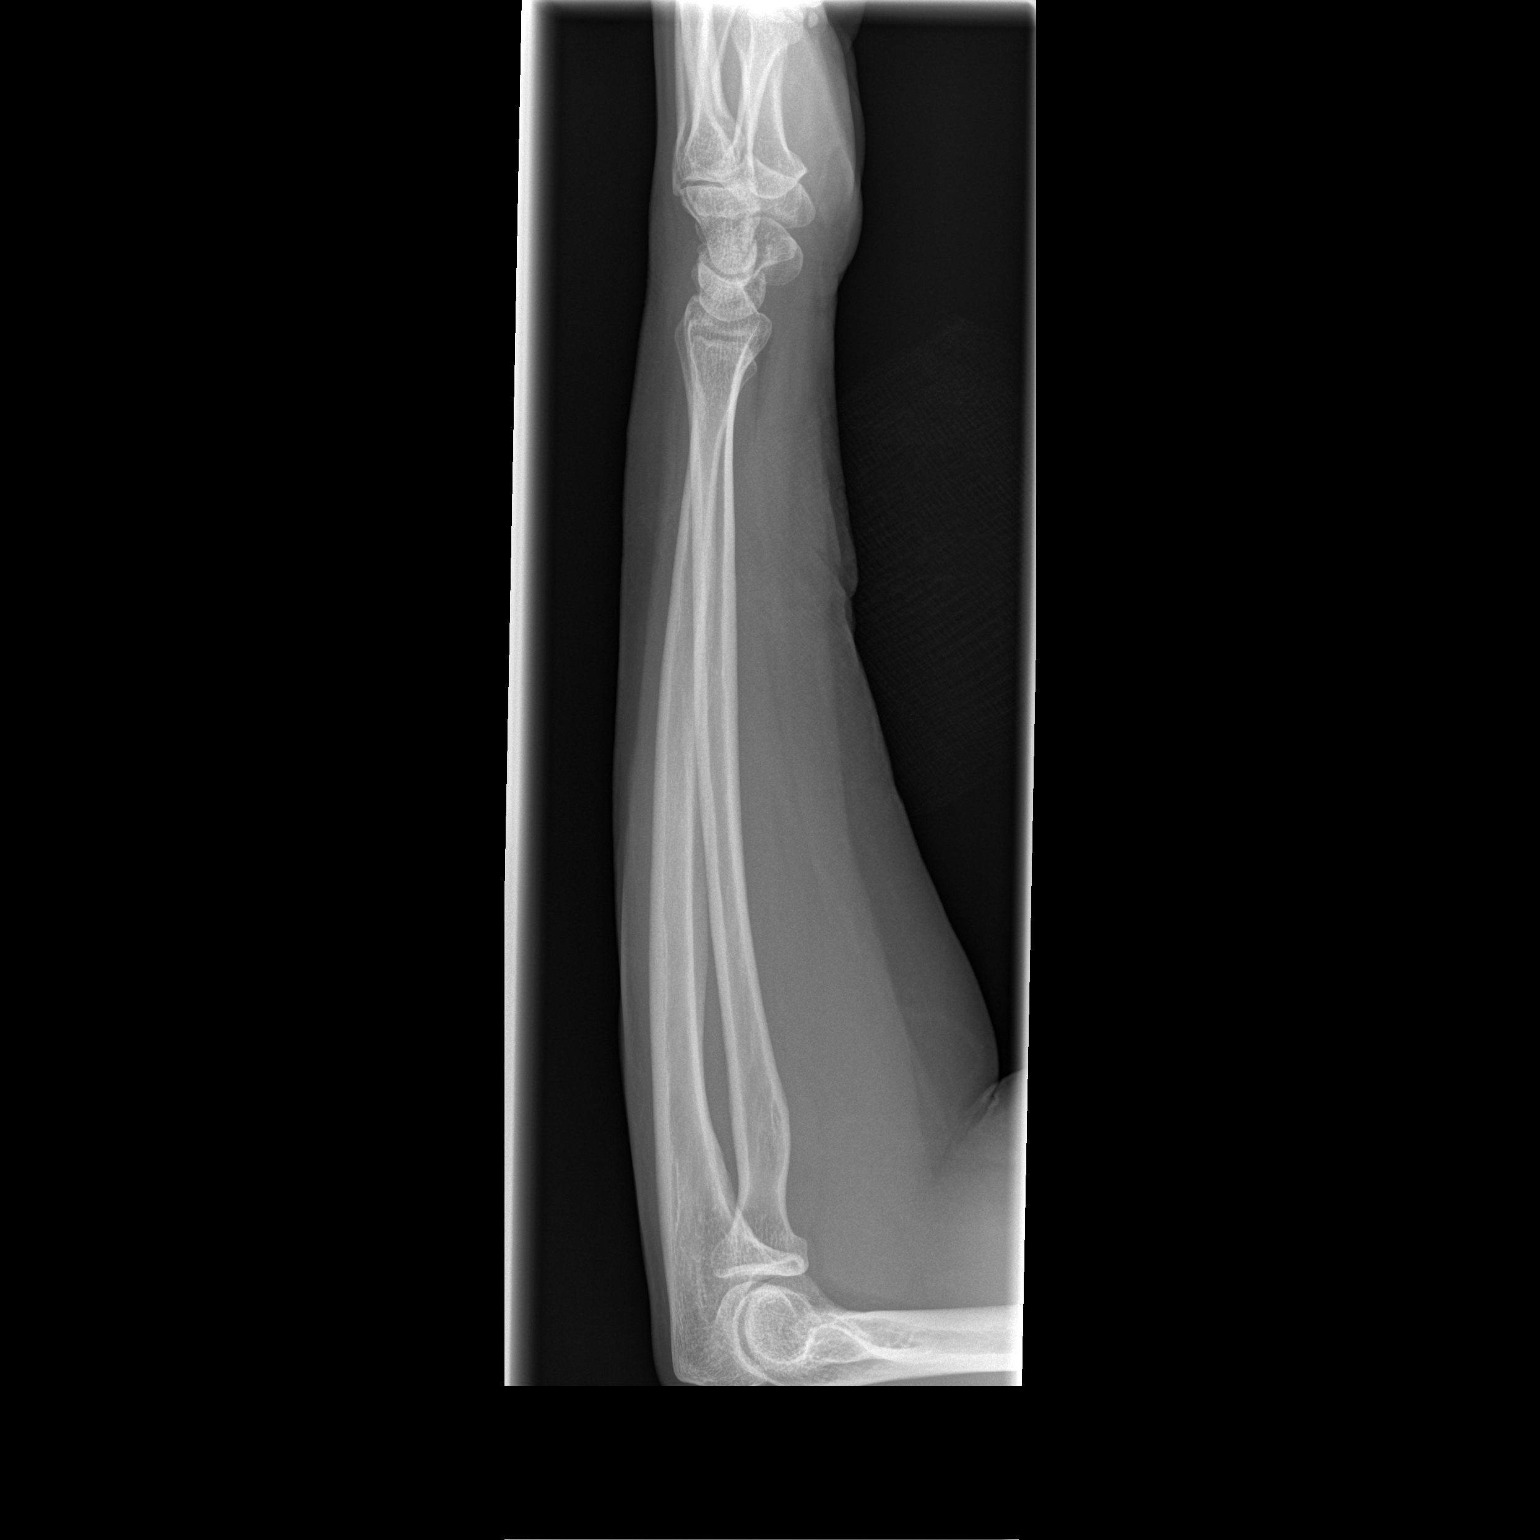

[2 of 2 positions shown; findings below may reference images not displayed]

FINDINGS: No fracture of the radius or ulna. The elbow joint and the wrist
joint appear normal on two views. Soft tissue injury along the
ventral surface in of the forearm. No radiodense foreign body.
IMPRESSION: No fracture or radiodense foreign body.

## 2018-10-26 ENCOUNTER — Encounter (HOSPITAL_BASED_OUTPATIENT_CLINIC_OR_DEPARTMENT_OTHER): Payer: Self-pay | Admitting: *Deleted

## 2018-10-26 ENCOUNTER — Emergency Department (HOSPITAL_BASED_OUTPATIENT_CLINIC_OR_DEPARTMENT_OTHER)
Admission: EM | Admit: 2018-10-26 | Discharge: 2018-10-26 | Disposition: A | Discharge status: Home / Self Care | Payer: Self-pay | Attending: Emergency Medicine | Admitting: Emergency Medicine

## 2018-10-26 ENCOUNTER — Other Ambulatory Visit: Payer: Self-pay

## 2018-10-26 DIAGNOSIS — Y9389 Activity, other specified: Secondary | ICD-10-CM | POA: Insufficient documentation

## 2018-10-26 DIAGNOSIS — S51812A Laceration without foreign body of left forearm, initial encounter: Secondary | ICD-10-CM | POA: Insufficient documentation

## 2018-10-26 DIAGNOSIS — S0181XA Laceration without foreign body of other part of head, initial encounter: Secondary | ICD-10-CM

## 2018-10-26 DIAGNOSIS — Y9289 Other specified places as the place of occurrence of the external cause: Secondary | ICD-10-CM | POA: Insufficient documentation

## 2018-10-26 DIAGNOSIS — Y999 Unspecified external cause status: Secondary | ICD-10-CM | POA: Insufficient documentation

## 2018-10-26 DIAGNOSIS — W268XXA Contact with other sharp object(s), not elsewhere classified, initial encounter: Secondary | ICD-10-CM | POA: Insufficient documentation

## 2018-10-26 MED ORDER — BACITRACIN ZINC 500 UNIT/GM EX OINT: TOPICAL_OINTMENT | Freq: Once | CUTANEOUS | Status: DC

## 2018-10-26 MED ORDER — LIDOCAINE-EPINEPHRINE (PF) 2 %-1:200000 IJ SOLN
20.0000 mL | Freq: Once | INTRAMUSCULAR | Status: AC
  Administered 2018-10-26: 15:00:00 10 mL
  Filled 2018-10-26: qty 20

## 2018-10-26 MED ORDER — HYDROCODONE-ACETAMINOPHEN 5-325 MG PO TABS: 1.0000 | ORAL_TABLET | ORAL | 0 refills | Status: DC | PRN

## 2018-10-26 MED ORDER — LIDOCAINE-EPINEPHRINE (PF) 2 %-1:200000 IJ SOLN
INTRAMUSCULAR | Status: AC
  Administered 2018-10-26: 10 mL
  Filled 2018-10-26: qty 10

## 2018-10-26 NOTE — Discharge Instructions (Addendum)
You were evaluated in the Emergency Department and after careful evaluation, we did not find any emergent condition requiring admission or further testing in the hospital.  Your symptoms today seem to be due to a laceration to the arm, which we sutured today.  Your stitches will need to be removed in 10 to 14 days by a healthcare professional.  You can use the pain medication provided for significant pain not improving with Tylenol or ibuprofen, but please be careful as this medication can make you sleepy.  Please return to the Emergency Department if you experience any worsening of your condition.  We encourage you to follow up with a primary care provider.  Thank you for allowing [place] to be a part of your care.

## 2018-10-26 NOTE — ED Provider Notes (Signed)
[facility] Emergency Department Provider Note MRN:  [id number]  Arrival date & time: 10/26/18     Chief Complaint   Laceration   History of Present Illness   Amber Sutton is a 48 y.o. year-old female with a history of ovarian cancer presenting to the ED with chief complaint of laceration.  Patient explains that she was helping a friend clear out her basement, was going through a box that had Christmas ornaments.  Found that 1 of them was broken and she excellently grazed her left forearm against a piece of broken glass, causing a laceration.  Quickly washed it and try to put the skin edges back together with Band-Aids, but the pain continued today.  Denies any other trauma, no fever, no other complaints.  Review of Systems  A problem-focused ROS was performed. Positive for laceration.  Patient denies head trauma.  Patient's Health History    Past Medical History:  Diagnosis Date  . Depression   . Fever blister   . Ovarian cancer ([place])   . Urinary tract infection     Past Surgical History:  Procedure Laterality Date  . ABDOMINAL HYSTERECTOMY    . CESAREAN SECTION      History reviewed. No pertinent family history.  Social History   Socioeconomic History  . Marital status: Single    Spouse name: Not on file  . Number of children: Not on file  . Years of education: Not on file  . Highest education level: Not on file  Occupational History  . Not on file  Social Needs  . Financial resource strain: Not on file  . Food insecurity:    Worry: Not on file    Inability: Not on file  . Transportation needs:    Medical: Not on file    Non-medical: Not on file  Tobacco Use  . Smoking status: Former Smoker    Packs/day: 0.50    Types: Cigarettes    Last attempt to quit: 01/24/2018    Years since quitting: 0.7  . Smokeless tobacco: Never Used  Substance and Sexual Activity  . Alcohol use: Yes    Comment: rare  . Drug use: Yes    Types:  Marijuana    Comment: occasional  . Sexual activity: Not on file  Lifestyle  . Physical activity:    Days per week: Not on file    Minutes per session: Not on file  . Stress: Not on file  Relationships  . Social connections:    Talks on phone: Not on file    Gets together: Not on file    Attends religious service: Not on file    Active member of club or organization: Not on file    Attends meetings of clubs or organizations: Not on file    Relationship status: Not on file  . Intimate partner violence:    Fear of current or ex partner: Not on file    Emotionally abused: Not on file    Physically abused: Not on file    Forced sexual activity: Not on file  Other Topics Concern  . Not on file  Social History Narrative  . Not on file     Physical Exam  Vital Signs and Nursing Notes reviewed Vitals:   10/26/18 1423  BP: 130/84  Pulse: 92  Resp: 18  Temp: 98 F (36.7 C)  SpO2: 100%    CONSTITUTIONAL: Well-appearing, NAD NEURO:  Alert and oriented x 3, no focal deficits  EYES:  eyes equal and reactive ENT/NECK:  no LAD, no JVD CARDIO: Regular rate, well-perfused, normal S1 and S2 PULM:  CTAB no wheezing or rhonchi GI/GU:  normal bowel sounds, non-distended, non-tender MSK/SPINE:  No gross deformities, no edema SKIN: 6 cm linear laceration to the posterior aspect of the left forearm PSYCH:  Appropriate speech and behavior  Diagnostic and Interventional Summary    Labs Reviewed - No data to display  No orders to display    Medications  bacitracin ointment (has no administration in time range)  lidocaine-EPINEPHrine (XYLOCAINE W/EPI) 2 %-1:200000 (PF) injection 20 mL (10 mLs Other Given 10/26/18 1454)     .Marland KitchenLaceration Repair Date/Time: 10/26/2018 3:27 PM Performed by: Maudie Flakes, MD Authorized by: Maudie Flakes, MD   Consent:    Consent obtained:  Verbal   Consent given by:  Patient   Risks discussed:  Infection, retained foreign body and poor cosmetic  result Anesthesia (see MAR for exact dosages):    Anesthesia method:  Local infiltration   Local anesthetic:  Lidocaine 2% WITH epi Laceration details:    Location:  Shoulder/arm   Shoulder/arm location:  L lower arm   Length (cm):  6   Depth (mm):  5 Repair type:    Repair type:  Simple Pre-procedure details:    Preparation:  Patient was prepped and draped in usual sterile fashion Exploration:    Hemostasis achieved with:  Epinephrine   Wound exploration: wound explored through full range of motion and entire depth of wound probed and visualized     Contaminated: no (Probed thoroughly to exclude presence of foreign body, no foreign bodies, no retained pieces of glass.)   Treatment:    Area cleansed with:  Betadine and saline   Amount of cleaning:  Standard   Irrigation solution:  Sterile saline   Irrigation method:  Syringe Skin repair:    Repair method:  Sutures   Suture size:  4-0   Suture material:  Prolene   Suture technique:  Simple interrupted and horizontal mattress   Number of sutures:  7 Approximation:    Approximation:  Close Post-procedure details:    Dressing:  Antibiotic ointment and non-adherent dressing   Patient tolerance of procedure:  Tolerated well, no immediate complications Comments:     Central horizontal mattress to bring the wound edges together, surrounded by simple interrupted sutures to complete the suture repair.   Critical Care  ED Course and Medical Decision Making  I have reviewed the triage vital signs and the nursing notes.  Pertinent labs & imaging results that were available during my care of the patient were reviewed by me and considered in my medical decision making (see below for details).  Linear laceration with healthy skin edges, was thoroughly cleaned immediately after the injury.  Patient's presentation is delayed but seem to be a good candidate for delayed primary closure.  Repaired as described above, bacitracin applied, will  follow-up with healthcare provider for removal of sutures.  After the discussed management above, the patient was determined to be safe for discharge.  The patient was in agreement with this plan and all questions regarding their care were answered.  ED return precautions were discussed and the patient will return to the ED with any significant worsening of condition.  Barth Kirks. Sedonia Small, MD [place] [email or web address]  Final Clinical Impressions(s) / ED Diagnoses     ICD-10-CM   1. Laceration of forehead, initial encounter  S01.[place]     ED Discharge Orders         Ordered    HYDROcodone-acetaminophen (NORCO/VICODIN) 5-325 MG tablet  Every 4 hours PRN     10/26/18 1524             Maudie Flakes, MD 10/26/18 1530

## 2018-10-26 NOTE — ED Triage Notes (Signed)
Pt c/o lac to left arm  By glass x 18 hrs ago

## 2018-10-26 NOTE — ED Notes (Signed)
ED Provider at bedside. 

## 2018-11-02 ENCOUNTER — Other Ambulatory Visit: Payer: Self-pay

## 2018-11-02 ENCOUNTER — Emergency Department (HOSPITAL_BASED_OUTPATIENT_CLINIC_OR_DEPARTMENT_OTHER)
Admission: EM | Admit: 2018-11-02 | Discharge: 2018-11-02 | Disposition: A | Discharge status: Home / Self Care | Payer: Self-pay | Attending: Emergency Medicine | Admitting: Emergency Medicine

## 2018-11-02 ENCOUNTER — Encounter (HOSPITAL_BASED_OUTPATIENT_CLINIC_OR_DEPARTMENT_OTHER): Payer: Self-pay | Admitting: *Deleted

## 2018-11-02 DIAGNOSIS — S51812D Laceration without foreign body of left forearm, subsequent encounter: Secondary | ICD-10-CM | POA: Insufficient documentation

## 2018-11-02 DIAGNOSIS — Z4802 Encounter for removal of sutures: Secondary | ICD-10-CM | POA: Insufficient documentation

## 2018-11-02 DIAGNOSIS — Z87891 Personal history of nicotine dependence: Secondary | ICD-10-CM | POA: Insufficient documentation

## 2018-11-02 DIAGNOSIS — F902 Attention-deficit hyperactivity disorder, combined type: Secondary | ICD-10-CM | POA: Insufficient documentation

## 2018-11-02 DIAGNOSIS — Z5189 Encounter for other specified aftercare: Secondary | ICD-10-CM

## 2018-11-02 DIAGNOSIS — X58XXXA Exposure to other specified factors, initial encounter: Secondary | ICD-10-CM | POA: Insufficient documentation

## 2018-11-02 MED ORDER — DOXYCYCLINE HYCLATE 100 MG PO CAPS: 100.0000 mg | ORAL_CAPSULE | Freq: Two times a day (BID) | ORAL | 0 refills | Status: AC

## 2018-11-02 NOTE — Discharge Instructions (Signed)
Your sutures were removed and we applied steri strips to keep the wound closed. Take the antibiotics as directed. Wound care instructions included. Given Tylenol and/or Motrin as needed for pain. Return to the ED with any worsening arm redness, fever, chills, or other concerning symptoms.

## 2018-11-02 NOTE — ED Notes (Signed)
Here for suture check from lac to left forearm,  Sutures intact  No redness or swelling noted

## 2018-11-02 NOTE — ED Provider Notes (Signed)
Emergency Department Provider Note   I have reviewed the triage vital signs and the nursing notes.   HISTORY  Chief Complaint Wound Check   HPI Amber Sutton is a 48 y.o. female presents to the emergency department for evaluation of left forearm laceration wound evaluation.  Patient was seen on 4/9 for laceration to the left forearm.  The wound was old but reportedly cleaned immediately afterwards.  Wound was explored and patient deemed to be a good candidate for delayed primary closure which was performed.  Patient states that she has been caring for the wound as instructed.  She has noticed that a stitch in the middle appears red and irritated.  She reports minimal discharge.  No fevers or chills.  She is having pain in the wound area.  No streaking rash up the arm.  Denies any numbness in the arm/hand.   Past Medical History:  Diagnosis Date  . Depression   . Fever blister   . Ovarian cancer ([place])   . Urinary tract infection     Patient Active Problem List   Diagnosis Date Noted  . Right shoulder pain 12/30/2016  . Opiate abuse, episodic ([place]) 12/05/2016  . Marijuana abuse 12/05/2016  . Benzodiazepine abuse ([place]) 12/05/2016  . Great toe pain, right 07/06/2016  . Finger injury, right, initial encounter 07/06/2016  . ADHD (attention deficit hyperactivity disorder), combined type 02/01/2016  . Major depressive disorder, recurrent episode ([place]) 12/23/2012  . Panic disorder 12/23/2012    Past Surgical History:  Procedure Laterality Date  . ABDOMINAL HYSTERECTOMY    . CESAREAN SECTION      Allergies Patient has no known allergies.  No family history on file.  Social History Social History   Tobacco Use  . Smoking status: Former Smoker    Packs/day: 0.50    Types: Cigarettes    Last attempt to quit: 01/24/2018    Years since quitting: 0.7  . Smokeless tobacco: Never Used  Substance Use Topics  . Alcohol use: Yes    Comment: rare  . Drug use: Yes    Types:  Marijuana    Comment: occasional    Review of Systems  Constitutional: No fever/chills Skin: Left forearm laceration with redness.   10-point ROS otherwise negative.  ____________________________________________   PHYSICAL EXAM:  VITAL SIGNS: ED Triage Vitals  Enc Vitals Group     BP 11/02/18 1202 (!) 119/94     Pulse Rate 11/02/18 1202 72     Resp 11/02/18 1202 18     Temp 11/02/18 1202 97.7 F (36.5 C)     Temp Source 11/02/18 1202 Oral     SpO2 11/02/18 1202 100 %     Weight 11/02/18 1200 134 lb 14.7 oz (61.2 kg)     Height 11/02/18 1200 5' 2.75" (1.594 m)     Pain Score 11/02/18 1200 7   Constitutional: Alert and oriented. Well appearing and in no acute distress. Eyes: Conjunctivae are normal.  Head: Atraumatic. Nose: No congestion/rhinnorhea. Mouth/Throat: Mucous membranes are moist.   Neck: No stridor.   Cardiovascular: Normal rate, regular rhythm.  Respiratory: Normal respiratory effort.   Gastrointestinal: Soft and nontender.  Musculoskeletal: No gross deformities of extremities. Neurologic:  Normal speech and language. Skin: 6 cm laceration over the left forearm. Minimal erythema surrounding the middle stitch without surrounding cellulitis. No discharge expressed from the wound. No erythema up the arm.   ____________________________________________   PROCEDURES  Procedure(s) performed:   .Suture Removal Date/Time: 11/02/2018  12:21 PM Performed by: Margette Fast, MD Authorized by: Margette Fast, MD   Consent:    Consent obtained:  Verbal   Consent given by:  Patient   Risks discussed:  Bleeding, pain and wound separation   Alternatives discussed:  Delayed treatment Location:    Location:  Upper extremity   Upper extremity location:  Arm   Arm location:  L lower arm Procedure details:    Wound appearance:  Tender and pink   Number of sutures removed:  7 Post-procedure details:    Post-removal:  Steri-Strips applied and dressing applied    Patient tolerance of procedure:  Tolerated well, no immediate complications Comments:     Minimal separation of the wound laterally. Steri strips applied.      ____________________________________________   INITIAL IMPRESSION / ASSESSMENT AND PLAN / ED COURSE  Pertinent labs & imaging results that were available during my care of the patient were reviewed by me and considered in my medical decision making (see chart for details).   Patient presents to the emergency department with left arm pain and some irritation around the middle suture of her left forearm.  This was a delayed primary closure.  The sutures were removed on today, day 7, given some concern for irritation and possibly developing infection over the middle suture.  The wound showed minimal separation.  This resolved with Steri-Strip placement and cleaning of the wound.  No visible foreign bodies.  No concern for surrounding cellulitis or developing infection in the arm.  Advised Tylenol/Motrin.  Will cover with doxycycline and have the patient follow with PCP.  Discussed ED return precautions.   ____________________________________________  FINAL CLINICAL IMPRESSION(S) / ED DIAGNOSES  Final diagnoses:  Visit for wound check  Visit for suture removal    NEW OUTPATIENT MEDICATIONS STARTED DURING THIS VISIT:  New Prescriptions   DOXYCYCLINE (VIBRAMYCIN) 100 MG CAPSULE    Take 1 capsule (100 mg total) by mouth 2 (two) times daily for 7 days.    Note:  This document was prepared using Dragon voice recognition software and may include unintentional dictation errors.  Nanda Quinton, MD Emergency Medicine    Long, [facility], MD 11/02/18 1224

## 2018-11-02 NOTE — ED Notes (Signed)
Telfa and 2 inch Kling wrap applied

## 2018-11-02 NOTE — ED Triage Notes (Signed)
States she thinks her left arm laceration suture line is infected.

## 2019-10-05 ENCOUNTER — Other Ambulatory Visit: Payer: Self-pay

## 2019-10-05 ENCOUNTER — Encounter (HOSPITAL_BASED_OUTPATIENT_CLINIC_OR_DEPARTMENT_OTHER): Payer: Self-pay

## 2019-10-05 ENCOUNTER — Emergency Department (HOSPITAL_BASED_OUTPATIENT_CLINIC_OR_DEPARTMENT_OTHER)
Admission: EM | Admit: 2019-10-05 | Discharge: 2019-10-05 | Disposition: A | Discharge status: Home / Self Care | Payer: Self-pay | Attending: Emergency Medicine | Admitting: Emergency Medicine

## 2019-10-05 DIAGNOSIS — Y999 Unspecified external cause status: Secondary | ICD-10-CM | POA: Insufficient documentation

## 2019-10-05 DIAGNOSIS — Y9389 Activity, other specified: Secondary | ICD-10-CM | POA: Insufficient documentation

## 2019-10-05 DIAGNOSIS — Y9289 Other specified places as the place of occurrence of the external cause: Secondary | ICD-10-CM | POA: Insufficient documentation

## 2019-10-05 DIAGNOSIS — S3141XA Laceration without foreign body of vagina and vulva, initial encounter: Secondary | ICD-10-CM | POA: Insufficient documentation

## 2019-10-05 DIAGNOSIS — X58XXXA Exposure to other specified factors, initial encounter: Secondary | ICD-10-CM | POA: Insufficient documentation

## 2019-10-05 NOTE — Discharge Instructions (Addendum)
Avoid intercourse until completely healed.  Follow up with an OB GYN doctor to discuss possible treatment to help prevent future episodes.  Return to [facility] ED, or [facility] if you have recurrent bleeding that does not stop.

## 2019-10-05 NOTE — ED Provider Notes (Signed)
[facility] EMERGENCY DEPARTMENT Provider Note   CSN: [id number] Arrival date & time: 10/05/19  1522     History Chief Complaint  Patient presents with  . Vaginal Bleeding    Amber Sutton is a 49 y.o. female.  HPI   Patient presented to the ED for evaluation of vaginal bleeding.  Patient has a history of having cancer.  She had a total hysterectomy several years ago.  Patient states she only started having intercourse again recently.  She has noticed some intermittent vaginal bleeding.  45 minutes prior to arrival she was having vigorous consensual intercourse and she suddenlystarted having heavy vaginal bleeding.  She tried resting but when she continued to bleed and went through several pads she continued ED.  Patient denies any heaviness.  No fevers or chills  Past Medical History:  Diagnosis Date  . Depression   . Fever blister   . Ovarian cancer ([place])   . Urinary tract infection     Patient Active Problem List   Diagnosis Date Noted  . Right shoulder pain 12/30/2016  . Opiate abuse, episodic ([place]) 12/05/2016  . Marijuana abuse 12/05/2016  . Benzodiazepine abuse ([place]) 12/05/2016  . Great toe pain, right 07/06/2016  . Finger injury, right, initial encounter 07/06/2016  . ADHD (attention deficit hyperactivity disorder), combined type 02/01/2016  . Major depressive disorder, recurrent episode ([place]) 12/23/2012  . Panic disorder 12/23/2012    Past Surgical History:  Procedure Laterality Date  . ABDOMINAL HYSTERECTOMY    . CESAREAN SECTION       OB History   No obstetric history on file.     History reviewed. No pertinent family history.  Social History   Tobacco Use  . Smoking status: Former Smoker    Packs/day: 0.50    Types: Cigarettes    Quit date: 01/24/2018    Years since quitting: 1.6  . Smokeless tobacco: Never Used  Substance Use Topics  . Alcohol use: Yes    Comment: rare  . Drug use: Yes    Types: Marijuana    Comment: occasional     Home Medications Prior to Admission medications   Medication Sig Start Date End Date Taking? Authorizing Provider  HYDROcodone-acetaminophen (NORCO/VICODIN) 5-325 MG tablet Take 1 tablet by mouth every 4 (four) hours as needed. 10/26/18   Maudie Flakes, MD    Allergies    Patient has no known allergies.  Review of Systems   Review of Systems  All other systems reviewed and are negative.   Physical Exam Updated Vital Signs BP (!) 132/107 (BP Location: Left Arm)   Pulse 85   Temp 97.7 F (36.5 C) (Oral)   Resp 17   Ht 1.588 m (5' 2.5")   Wt 52.2 kg   SpO2 100%   BMI 20.70 kg/m   Physical Exam Vitals and nursing note reviewed.  Constitutional:      General: She is not in acute distress.    Appearance: She is well-developed.  HENT:     Head: Normocephalic and atraumatic.     Right Ear: External ear normal.     Left Ear: External ear normal.  Eyes:     General: No scleral icterus.       Right eye: No discharge.        Left eye: No discharge.     Conjunctiva/sclera: Conjunctivae normal.  Neck:     Trachea: No tracheal deviation.  Cardiovascular:     Rate and Rhythm: Normal rate.  Pulmonary:     Effort: Pulmonary effort is normal. No respiratory distress.     Breath sounds: No stridor.  Abdominal:     General: There is no distension.  Genitourinary:    Labia:        Right: No rash, tenderness or injury.        Left: No rash, tenderness or injury.      Vagina: Signs of injury present. Bleeding present.     Uterus: Absent.      Comments: Clot removed from vault, horizontal laceration noted at vaginal cuff, no active bleeding but fresh clot noted Musculoskeletal:        General: No swelling or deformity.     Cervical back: Neck supple.  Skin:    General: Skin is warm and dry.     Findings: No rash.  Neurological:     Mental Status: She is alert.     Cranial Nerves: Cranial nerve deficit: no gross deficits.     ED Results / Procedures / Treatments    Labs (all labs ordered are listed, but only abnormal results are displayed) Labs Reviewed - No data to display  EKG None  Radiology No results found.  Procedures Procedures (including critical care time)  Medications Ordered in ED Medications - No data to display  ED Course  I have reviewed the triage vital signs and the nursing notes.  Pertinent labs & imaging results that were available during my care of the patient were reviewed by me and considered in my medical decision making (see chart for details).    MDM Rules/Calculators/A&P                      Pt has a vaginal laceration.  Likely related to atrophy associated with her lack of estrogen.  Laceration is at cuff.  No amenable to suturing in the ED.  Appears to have stopped vigorously bleeding.  Discussed supportive care, bed rest.   No intercourse.  Follow up with her OB GYN doctor to discuss whether she is a candidate for topical estrogen treatment. Final Clinical Impression(s) / ED Diagnoses Final diagnoses:  Non-obstetric vaginal laceration without foreign body or perineal laceration, initial encounter    Rx / DC Orders ED Discharge Orders    None       Dorie Rank, MD 10/05/19 1640

## 2019-10-05 NOTE — ED Triage Notes (Signed)
Pt having 'rough intercourse from behind' approx 45 mins ago, states heavy amount of bleeding. Has bled through 2 tampons and pads.

## 2022-06-21 ENCOUNTER — Emergency Department (HOSPITAL_BASED_OUTPATIENT_CLINIC_OR_DEPARTMENT_OTHER)
Admission: EM | Admit: 2022-06-21 | Discharge: 2022-06-21 | Discharge status: Against Medical Advice | Payer: Commercial Managed Care - PPO | Attending: Emergency Medicine | Admitting: Emergency Medicine

## 2022-06-21 ENCOUNTER — Other Ambulatory Visit: Payer: Self-pay

## 2022-06-21 DIAGNOSIS — Z5321 Procedure and treatment not carried out due to patient leaving prior to being seen by health care provider: Secondary | ICD-10-CM | POA: Insufficient documentation

## 2022-06-21 DIAGNOSIS — H5789 Other specified disorders of eye and adnexa: Secondary | ICD-10-CM | POA: Diagnosis present

## 2022-06-21 DIAGNOSIS — H109 Unspecified conjunctivitis: Secondary | ICD-10-CM | POA: Diagnosis not present

## 2022-06-21 NOTE — ED Triage Notes (Signed)
Patient presents with redness to R eye. Reports drainage.

## 2022-09-27 ENCOUNTER — Encounter: Payer: Self-pay | Admitting: Nurse Practitioner

## 2022-09-27 ENCOUNTER — Ambulatory Visit: Payer: Commercial Managed Care - PPO | Admitting: Nurse Practitioner

## 2022-09-27 VITALS — BP 122/80 | HR 77 | Ht 62.75 in | Wt 129.4 lb

## 2022-09-27 DIAGNOSIS — F1310 Sedative, hypnotic or anxiolytic abuse, uncomplicated: Secondary | ICD-10-CM

## 2022-09-27 DIAGNOSIS — F1110 Opioid abuse, uncomplicated: Secondary | ICD-10-CM

## 2022-09-27 DIAGNOSIS — B009 Herpesviral infection, unspecified: Secondary | ICD-10-CM

## 2022-09-27 DIAGNOSIS — F3130 Bipolar disorder, current episode depressed, mild or moderate severity, unspecified: Secondary | ICD-10-CM

## 2022-09-27 MED ORDER — LAMOTRIGINE 50 MG PO TBDP: ORAL_TABLET | ORAL | 0 refills | Status: DC

## 2022-09-27 MED ORDER — RISPERIDONE 0.5 MG PO TABS: 0.5000 mg | ORAL_TABLET | Freq: Every day | ORAL | 1 refills | Status: DC

## 2022-09-27 MED ORDER — VALACYCLOVIR HCL 1 G PO TABS: 1000.0000 mg | ORAL_TABLET | Freq: Two times a day (BID) | ORAL | 11 refills | Status: DC

## 2022-09-27 MED ORDER — ACYCLOVIR 5 % EX OINT: 1.0000 | TOPICAL_OINTMENT | Freq: Two times a day (BID) | CUTANEOUS | 1 refills | Status: DC | PRN

## 2022-09-27 NOTE — Progress Notes (Unsigned)
Orma Render, DNP, AGNP-c [facility] [address] [place], [place] [address] [place] [phone]   New patient visit   Patient: Amber Sutton   DOB: 1970-08-27   52 y.o. Female  MRN: [id number] Visit Date: 09/27/2022  Patient Care Team: Orma Render, NP as PCP - General (Nurse Practitioner)  Today's Vitals   09/27/22 1500  BP: 122/80  Pulse: 77  Weight: 129 lb 6.4 oz (58.7 kg)  Height: 5' 2.75" (1.594 m)   Body mass index is 23.11 kg/m.   Today's healthcare provider: Orma Render, NP   Chief Complaint  Patient presents with   other    New pt. Est. Discuss anxiety and depression, and discuss cold sores,    Subjective    Amber Sutton is a 52 y.o. female who presents today as a new patient to establish care.    Patient endorses the following concerns presently:   History reviewed and reveals the following: Past Medical History:  Diagnosis Date   Depression    Fever blister    Ovarian cancer ([place])    Urinary tract infection    Past Surgical History:  Procedure Laterality Date   ABDOMINAL HYSTERECTOMY     CESAREAN SECTION     No family status information on file.   No family history on file. Social History   Socioeconomic History   Marital status: Single    Spouse name: Not on file   Number of children: Not on file   Years of education: Not on file   Highest education level: Not on file  Occupational History   Not on file  Tobacco Use   Smoking status: Former    Packs/day: 0.50    Types: Cigarettes    Quit date: 01/24/2018    Years since quitting: 4.6   Smokeless tobacco: Never  Vaping Use   Vaping Use: Never used  Substance and Sexual Activity   Alcohol use: Yes    Comment: rare   Drug use: Yes    Types: Marijuana    Comment: occasional   Sexual activity: Not on file  Other Topics Concern   Not on file  Social History Narrative   Not on file   Social Determinants of Health   Financial Resource Strain: Not  on file  Food Insecurity: Not on file  Transportation Needs: Not on file  Physical Activity: Not on file  Stress: Not on file  Social Connections: Not on file   Outpatient Medications Prior to Visit  Medication Sig   [DISCONTINUED] HYDROcodone-acetaminophen (NORCO/VICODIN) 5-325 MG tablet Take 1 tablet by mouth every 4 (four) hours as needed. (Patient not taking: Reported on 09/27/2022)   No facility-administered medications prior to visit.   No Known Allergies Immunization History  Administered Date(s) Administered   Tdap 06/24/2016    Health Maintenance Due Health Maintenance Topics with due status: Overdue     Topic Date Due   COVID-19 Vaccine Never done   HIV Screening Never done   Hepatitis C Screening Never done   PAP SMEAR-Modifier Never done   COLONOSCOPY (Pts 45-66yr Insurance coverage will need to be confirmed) Never done   MAMMOGRAM Never done   Zoster Vaccines- Shingrix Never done   INFLUENZA VACCINE Never done    Review of Systems All review of systems negative except what is listed in the HPI   Objective    BP 122/80   Pulse 77   Ht 5' 2.75" (1.594 m)  Wt 129 lb 6.4 oz (58.7 kg)   BMI 23.11 kg/m  Physical Exam  No results found for any visits on 09/27/22.  Assessment & Plan      Problem List Items Addressed This Visit   None    No follow-ups on file.      Acelin Ferdig, Coralee Pesa, NP, DNP, AGNP-C [facility]

## 2022-09-27 NOTE — Patient Instructions (Addendum)
I have sent in the Lamictal and Risperidone for you to start. I want to see how you are doing on this in about 4 weeks. We can check in with a virtual visit to follow-up.   I have sent the cold sore medication for you to have on hand.

## 2022-09-28 DIAGNOSIS — B009 Herpesviral infection, unspecified: Secondary | ICD-10-CM | POA: Insufficient documentation

## 2022-09-28 NOTE — Assessment & Plan Note (Addendum)
Historic diagnosis in chart review noted from 2018. Patient has reportedly been off of benzodiazepines for 5-6 months at this time, therefore, no risk of withdrawal is present. Given the history of dependence and current marijuana use, recommend avoid restart of this medication due to significant risk of repeat dependence.  Plan: - If symptoms of anxiety are not well controlled with restart of Lamictal and Risperdal will send referral to psychiatry for evaluation for panic and anxiety control.

## 2022-09-28 NOTE — Assessment & Plan Note (Signed)
Significant challenges to mood including low motivation, energy, and a tendency to isolate. She has a history of stopping her medication regimen when her symptoms improve, which she understands is detrimental. Recently stopped taking Lamictal, Risperidone, Alprazolam, and Lunesta about five to six months ago. Eustaquio Maize has had a positive response to Lamictal and Risperidone in the past without notable side effects and denies any history of seizures. She also reports a history of daily alprazolam use, however, given history of abuse in the chart, I will not be able to prescribe this medication.  Plan: - Begin Lamictal (Lamotrigine) at 50 mg daily, planning to increase by 50 mg weekly until a maintenance dose of 200 mg daily is reached.  - Reintroduce Risperidone at 0.'5mg'$ , taking into account Beth's positive past experience and tolerance.  - If Alprazolam dosing is required, will send referral to psychiatry for chronic management given history of polysubstance abuse and requirement for close follow-up and monitoring.  - Arrange follow-up appointments in 4 weeks to monitor the efficacy and tolerability of medications, making adjustments to the treatment plan as necessary.

## 2022-09-28 NOTE — Assessment & Plan Note (Addendum)
Historic diagnosis from 2018. No evidence of use or abuse present at this time.  Plan: - According to policy, will avoid prescribing substances that could potentially cause a relapse in chronic substance misuse for patient safety and wellbeing.

## 2022-09-28 NOTE — Assessment & Plan Note (Signed)
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 She experiences outbreaks roughly every six months, with the most recent episode occurring about one and a half to two months ago. No current symptoms. In the past Amber Sutton has found relief with Zovirax (Acyclovir) cream and Valtrex (Valacyclovir) tablets in the past and requests these medications for future outbreaks. Plan: - Prescribe Valacyclovir tablets for use at the first sign of an outbreak. - Prescribe Acyclovir cream for topical application at the onset of symptoms. - Prepare for a possible prior authorization requirement for Acyclovir cream. - Educate Amber Sutton on recognizing Amber Sutton signs of herpes labialis outbreaks and the importance of Amber Sutton treatment initiation.

## 2022-10-18 ENCOUNTER — Telehealth: Payer: Self-pay | Admitting: Internal Medicine

## 2022-10-18 NOTE — Telephone Encounter (Signed)
Send P.A through covermymeds on Acyclovir 5% ointment

## 2022-10-22 NOTE — Telephone Encounter (Signed)
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 Coverage cannot be authorized.   Please advise

## 2022-10-26 ENCOUNTER — Ambulatory Visit: Payer: Self-pay | Admitting: Nurse Practitioner

## 2022-11-11 ENCOUNTER — Ambulatory Visit: Payer: Self-pay | Admitting: Nurse Practitioner

## 2022-11-25 ENCOUNTER — Encounter: Payer: Self-pay | Admitting: Nurse Practitioner

## 2023-11-09 ENCOUNTER — Ambulatory Visit: Admitting: Family Medicine

## 2023-11-10 ENCOUNTER — Ambulatory Visit: Admitting: Nurse Practitioner

## 2023-12-27 ENCOUNTER — Encounter: Payer: Self-pay | Admitting: Nurse Practitioner

## 2023-12-27 ENCOUNTER — Ambulatory Visit (INDEPENDENT_AMBULATORY_CARE_PROVIDER_SITE_OTHER): Admitting: Nurse Practitioner

## 2023-12-27 VITALS — BP 128/80 | HR 76 | Ht 62.75 in | Wt 139.0 lb

## 2023-12-27 DIAGNOSIS — F3132 Bipolar disorder, current episode depressed, moderate: Secondary | ICD-10-CM | POA: Diagnosis not present

## 2023-12-27 DIAGNOSIS — G4700 Insomnia, unspecified: Secondary | ICD-10-CM

## 2023-12-27 DIAGNOSIS — M25511 Pain in right shoulder: Secondary | ICD-10-CM | POA: Diagnosis not present

## 2023-12-27 DIAGNOSIS — F418 Other specified anxiety disorders: Secondary | ICD-10-CM | POA: Diagnosis not present

## 2023-12-27 DIAGNOSIS — Z5181 Encounter for therapeutic drug level monitoring: Secondary | ICD-10-CM | POA: Insufficient documentation

## 2023-12-27 DIAGNOSIS — R7989 Other specified abnormal findings of blood chemistry: Secondary | ICD-10-CM

## 2023-12-27 LAB — LIPID PANEL
Chol/HDL Ratio: 2.5 ratio (ref 0.0–4.4)
Cholesterol, Total: 167 mg/dL (ref 100–199)
HDL: 67 mg/dL (ref >39)
LDL Chol Calc (NIH): 87 mg/dL (ref 0–99)
Triglycerides: 68 mg/dL (ref 0–149)
VLDL Cholesterol Cal: 13 mg/dL (ref 5–40)

## 2023-12-27 LAB — COMPREHENSIVE METABOLIC PANEL WITH GFR
ALT: 39 IU/L — ABNORMAL HIGH (ref 0–32)
AST: 45 IU/L — ABNORMAL HIGH (ref 0–40)
Albumin: 3.7 g/dL — ABNORMAL LOW (ref 3.8–4.9)
Alkaline Phosphatase: 90 IU/L (ref 44–121)
BUN/Creatinine Ratio: 16 (ref 9–23)
BUN: 14 mg/dL (ref 6–24)
Bilirubin Total: <0.2 mg/dL (ref 0.0–1.2)
CO2: 21 mmol/L (ref 20–29)
Calcium: 8.9 mg/dL (ref 8.7–10.2)
Chloride: 103 mmol/L (ref 96–106)
Creatinine, Ser: 0.85 mg/dL (ref 0.57–1.00)
Globulin, Total: 3.1 g/dL (ref 1.5–4.5)
Glucose: 90 mg/dL (ref 70–99)
Potassium: 4.4 mmol/L (ref 3.5–5.2)
Sodium: 140 mmol/L (ref 134–144)
Total Protein: 6.8 g/dL (ref 6.0–8.5)
eGFR: 81 mL/min/{1.73_m2} (ref >59)

## 2023-12-27 LAB — CBC WITH DIFFERENTIAL/PLATELET
Basophils Absolute: 0 10*3/uL (ref 0.0–0.2)
Basos: 1 %
EOS (ABSOLUTE): 0.3 10*3/uL (ref 0.0–0.4)
Eos: 4 %
Hematocrit: 39.1 % (ref 34.0–46.6)
Hemoglobin: 12.6 g/dL (ref 11.1–15.9)
Immature Grans (Abs): 0 10*3/uL (ref 0.0–0.1)
Immature Granulocytes: 0 %
Lymphocytes Absolute: 2.1 10*3/uL (ref 0.7–3.1)
Lymphs: 28 %
MCH: 30.7 pg (ref 26.6–33.0)
MCHC: 32.2 g/dL (ref 31.5–35.7)
MCV: 95 fL (ref 79–97)
Monocytes Absolute: 0.7 10*3/uL (ref 0.1–0.9)
Monocytes: 9 %
Neutrophils Absolute: 4.5 10*3/uL (ref 1.4–7.0)
Neutrophils: 58 %
Platelets: 454 10*3/uL — ABNORMAL HIGH (ref 150–450)
RBC: 4.1 x10E6/uL (ref 3.77–5.28)
RDW: 12.4 % (ref 11.7–15.4)
WBC: 7.6 10*3/uL (ref 3.4–10.8)

## 2023-12-27 MED ORDER — LAMOTRIGINE 150 MG PO TABS: 150.0000 mg | ORAL_TABLET | Freq: Every day | ORAL | 1 refills | Status: AC

## 2023-12-27 MED ORDER — RISPERIDONE 0.5 MG PO TABS: 0.5000 mg | ORAL_TABLET | Freq: Every day | ORAL | 1 refills | Status: AC

## 2023-12-27 MED ORDER — ESZOPICLONE 1 MG PO TABS: ORAL_TABLET | ORAL | 0 refills | Status: AC

## 2023-12-27 MED ORDER — BUSPIRONE HCL 5 MG PO TABS: 5.0000 mg | ORAL_TABLET | Freq: Two times a day (BID) | ORAL | 6 refills | Status: AC | PRN

## 2023-12-27 NOTE — Patient Instructions (Addendum)
 We will plan to increase the Lamictal  to 150mg  once a day. I have also added the buspirone 5mg  that you can take up to twice a day for anxiety and depressive symptoms.   I also send in Lunesta for you to take to help with sleep.    I certainly hope the current situation improves and you begin to feel better soon.

## 2023-12-27 NOTE — Progress Notes (Signed)
 Dell Fennel, DNP, AGNP-c [facility]  [address] [place], [place] [address] [phone]  ESTABLISHED PATIENT- Chronic Health and/or Follow-Up Visit  Blood pressure 128/80, pulse 76, height 5' 2.75 (1.594 m), weight 139 lb (63 kg).     History of Present Illness Amber Sutton is a 53 year old female who presents with right shoulder pain and anxiety related to living conditions.  She experiences persistent right shoulder pain, described as deep and constant, with occasional stabbing sensations. The pain is localized deep in the shoulder and is not alleviated by massaging the area. She has used Goody powders for pain relief but is concerned about gastrointestinal side effects from frequent use.  She is experiencing significant anxiety and stress due to her living situation. Her non-biological daughter and granddaughter have moved in with her, along with their dog, which killed her cat and recently bit her landlord. She feels trapped in the lease and is responsible for all household expenses. This situation has led to increased anxiety, stress, and a feeling of being unable to express emotions without being attacked. She feels uncomfortable in her own home and avoids returning home after work.  She is currently taking Lamictal  100 mg daily at 2 PM. She has previously taken Lunesta  for sleep, which she found effective without side effects. She reports no side effects from Lamictal  or risperidone . Her sleep is poor, often getting only four hours per night, which she considers a good night's sleep. She experiences racing thoughts and difficulty sleeping due to stress and anxiety.  She has a history of anxiety exacerbations, during which she tends to shut down and isolate herself. She does not engage in social activities and prefers to stay at home.    All ROS negative with exception of what is listed above.   PHYSICAL EXAM Physical Exam Vitals and nursing note  reviewed.  Constitutional:      Appearance: Normal appearance.  HENT:     Head: Normocephalic.   Cardiovascular:     Rate and Rhythm: Normal rate and regular rhythm.     Pulses: Normal pulses.     Heart sounds: Normal heart sounds.  Pulmonary:     Effort: Pulmonary effort is normal.     Breath sounds: Normal breath sounds.   Musculoskeletal:        General: Normal range of motion.     Right lower leg: No edema.     Left lower leg: No edema.   Skin:    General: Skin is warm and dry.     Capillary Refill: Capillary refill takes less than 2 seconds.   Neurological:     General: No focal deficit present.     Mental Status: She is alert.   Psychiatric:        Attention and Perception: Attention normal.        Mood and Affect: Mood is depressed. Affect is tearful.        Speech: Speech normal.        Behavior: Behavior is cooperative.        Thought Content: Thought content normal.        Judgment: Judgment normal.      PLAN Problem List Items Addressed This Visit     Right shoulder pain - Primary   Chronic right shoulder pain with deep, stabbing sensations, possibly related to rotator cuff pathology or bone spur. Pain is constant and affects range of motion, with micro tears considered as a potential cause. Kathleen Tamm  intervention is advised to prevent worsening of the condition. - Refer to orthopedist specializing in shoulders at the [facility]      Relevant Orders   Ambulatory referral to Orthopedic Surgery   Insomnia, uncontrolled   Chronic difficulty sleeping, exacerbated by stress and anxiety. Previous positive response to Lunesta  for sleep without side effects. Adequate sleep is anticipated to improve overall stress and anxiety levels. - Prescribe Lunesta  for sleep      Relevant Medications   eszopiclone  (LUNESTA ) 1 MG TABS tablet   Situational anxiety   Increased anxiety and stress due to conflict with a housemate and an aggressive dog, exacerbated by  financial constraints and lack of support. Current medication includes Lamictal  and risperidone , with consideration for increasing Lamictal  dosage. Buspirone  is suggested for intermittent anxiety relief. Counseling was considered but declined. - Increase Lamictal  to 150 mg once daily - Prescribe buspirone  for as-needed use for anxiety - Check kidney and liver function with lab tests      Relevant Medications   busPIRone  (BUSPAR ) 5 MG tablet   Medication monitoring encounter   Relevant Orders   CBC with Differential/Platelet (Completed)   Comprehensive metabolic panel with GFR (Completed)   Lipid panel (Completed)   Bipolar affective disorder, current episode depressed (HCC)   Relevant Medications   risperiDONE  (RISPERDAL ) 0.5 MG tablet   lamoTRIgine  (LAMICTAL ) 150 MG tablet   busPIRone  (BUSPAR ) 5 MG tablet    Return in about 6 months (around 06/27/2024) for CPE.  Dell Fennel, DNP, AGNP-c

## 2023-12-28 ENCOUNTER — Ambulatory Visit: Payer: Self-pay | Admitting: Nurse Practitioner

## 2023-12-28 DIAGNOSIS — R945 Abnormal results of liver function studies: Secondary | ICD-10-CM

## 2023-12-28 DIAGNOSIS — R768 Other specified abnormal immunological findings in serum: Secondary | ICD-10-CM

## 2023-12-28 DIAGNOSIS — D691 Qualitative platelet defects: Secondary | ICD-10-CM

## 2023-12-28 DIAGNOSIS — R770 Abnormality of albumin: Secondary | ICD-10-CM

## 2023-12-29 ENCOUNTER — Ambulatory Visit: Admitting: Nurse Practitioner

## 2023-12-30 LAB — ACUTE VIRAL HEPATITIS (HAV, HBV, HCV)

## 2023-12-31 LAB — IRON AND TIBC
Iron Saturation: 25 % (ref 15–55)
Iron: 74 ug/dL (ref 27–159)
Total Iron Binding Capacity: 302 ug/dL (ref 250–450)
UIBC: 228 ug/dL (ref 131–425)

## 2023-12-31 LAB — HCV RT-PCR, QUANT (NON-GRAPH)

## 2023-12-31 LAB — ACUTE VIRAL HEPATITIS (HAV, HBV, HCV)
Hep A IgM: NEGATIVE
Hep B C IgM: NEGATIVE
Hepatitis B Surface Ag: NEGATIVE

## 2023-12-31 LAB — GAMMA GT: GGT: 29 IU/L (ref 0–60)

## 2023-12-31 LAB — SPECIMEN STATUS REPORT

## 2024-01-04 NOTE — Assessment & Plan Note (Signed)
 Chronic difficulty sleeping, exacerbated by stress and anxiety. Previous positive response to Lunesta  for sleep without side effects. Adequate sleep is anticipated to improve overall stress and anxiety levels. - Prescribe Lunesta  for sleep

## 2024-01-04 NOTE — Assessment & Plan Note (Signed)
 Chronic right shoulder pain with deep, stabbing sensations, possibly related to rotator cuff pathology or bone spur. Pain is constant and affects range of motion, with micro tears considered as a potential cause. Amber Sutton intervention is advised to prevent worsening of the condition. - Refer to orthopedist specializing in shoulders at the [facility]

## 2024-01-04 NOTE — Assessment & Plan Note (Signed)
 Increased anxiety and stress due to conflict with a housemate and an aggressive dog, exacerbated by financial constraints and lack of support. Current medication includes Lamictal  and risperidone , with consideration for increasing Lamictal  dosage. Buspirone  is suggested for intermittent anxiety relief. Counseling was considered but declined. - Increase Lamictal  to 150 mg once daily - Prescribe buspirone  for as-needed use for anxiety - Check kidney and liver function with lab tests

## 2024-02-16 ENCOUNTER — Other Ambulatory Visit: Payer: Self-pay | Admitting: Nurse Practitioner

## 2024-02-16 DIAGNOSIS — B009 Herpesviral infection, unspecified: Secondary | ICD-10-CM

## 2024-02-16 NOTE — Telephone Encounter (Unsigned)
 Copied from CRM [id number]. Topic: Clinical - Medication Refill >> Feb 16, 2024 12:43 PM Yolanda T wrote: Medication: valACYclovir  (VALTREX ) 1000 MG tablet  Has the patient contacted their pharmacy? Yes  This is the patient's preferred pharmacy:  [facility] [address] [place], [place] - [address] [place] [address] Phone: [phone] Fax: [phone]  Is this the correct pharmacy for this prescription? Yes  Has the prescription been filled recently? Yes  Is the patient out of the medication? Yes  Has the patient been seen for an appointment in the last year OR does the patient have an upcoming appointment? Yes  Can we respond through MyChart? Yes  Agent: Please be advised that Rx refills may take up to 3 business days. We ask that you follow-up with your pharmacy.

## 2024-02-17 MED ORDER — VALACYCLOVIR HCL 1 G PO TABS: 1000.0000 mg | ORAL_TABLET | Freq: Two times a day (BID) | ORAL | 11 refills | Status: AC

## 2024-06-20 NOTE — Progress Notes (Deleted)
 Flu: Covid: Pneumonia: Shingles:

## 2024-06-29 ENCOUNTER — Encounter: Payer: Self-pay | Admitting: Nurse Practitioner

## 2024-07-06 ENCOUNTER — Encounter: Payer: Self-pay | Admitting: Nurse Practitioner
